# Patient Record
Sex: Male | Born: 1990 | Race: Black or African American | Hispanic: No | Marital: Single | Smoking: Never smoker
Health system: Southern US, Community
[De-identification: ages and names within clinical notes are randomized; demographics above are authoritative.]

## PROBLEM LIST (undated history)

## (undated) HISTORY — PX: WISDOM TOOTH EXTRACTION: SHX21

---

## 2015-12-25 ENCOUNTER — Emergency Department (HOSPITAL_COMMUNITY): Payer: Federal, State, Local not specified - PPO

## 2015-12-25 ENCOUNTER — Emergency Department (HOSPITAL_COMMUNITY)
Admission: EM | Admit: 2015-12-25 | Discharge: 2015-12-25 | Disposition: A | Discharge status: Home / Self Care | Payer: Federal, State, Local not specified - PPO | Attending: Emergency Medicine | Admitting: Emergency Medicine

## 2015-12-25 ENCOUNTER — Encounter (HOSPITAL_COMMUNITY): Payer: Self-pay | Admitting: *Deleted

## 2015-12-25 DIAGNOSIS — K605 Anorectal fistula: Secondary | ICD-10-CM | POA: Diagnosis not present

## 2015-12-25 DIAGNOSIS — L03317 Cellulitis of buttock: Secondary | ICD-10-CM | POA: Insufficient documentation

## 2015-12-25 DIAGNOSIS — L0231 Cutaneous abscess of buttock: Secondary | ICD-10-CM | POA: Insufficient documentation

## 2015-12-25 DIAGNOSIS — L0291 Cutaneous abscess, unspecified: Secondary | ICD-10-CM

## 2015-12-25 DIAGNOSIS — K6289 Other specified diseases of anus and rectum: Secondary | ICD-10-CM | POA: Diagnosis present

## 2015-12-25 LAB — BASIC METABOLIC PANEL
Anion gap: 9 (ref 5–15)
BUN: 9 mg/dL (ref 6–20)
CALCIUM: 9.5 mg/dL (ref 8.9–10.3)
CHLORIDE: 104 mmol/L (ref 101–111)
CO2: 25 mmol/L (ref 22–32)
CREATININE: 0.96 mg/dL (ref 0.61–1.24)
GFR calc Af Amer: >60 mL/min (ref >60)
GFR calc non Af Amer: >60 mL/min (ref >60)
GLUCOSE: 121 mg/dL — AB (ref 65–99)
Potassium: 4.2 mmol/L (ref 3.5–5.1)
Sodium: 138 mmol/L (ref 135–145)

## 2015-12-25 LAB — CBC
HEMATOCRIT: 43.1 % (ref 39.0–52.0)
HEMOGLOBIN: 15.1 g/dL (ref 13.0–17.0)
MCH: 29.2 pg (ref 26.0–34.0)
MCHC: 35 g/dL (ref 30.0–36.0)
MCV: 83.4 fL (ref 78.0–100.0)
Platelets: 294 10*3/uL (ref 150–400)
RBC: 5.17 MIL/uL (ref 4.22–5.81)
RDW: 13 % (ref 11.5–15.5)
WBC: 13.7 10*3/uL — ABNORMAL HIGH (ref 4.0–10.5)

## 2015-12-25 MED ORDER — IOHEXOL 300 MG/ML  SOLN
100.0000 mL | Freq: Once | INTRAMUSCULAR | Status: AC | PRN
  Administered 2015-12-25: 100 mL via INTRAVENOUS

## 2015-12-25 MED ORDER — CLINDAMYCIN HCL 150 MG PO CAPS
300.0000 mg | ORAL_CAPSULE | Freq: Four times a day (QID) | ORAL | Status: AC
Start: 2015-12-25 — End: 2016-01-04

## 2015-12-25 MED ORDER — NAPROXEN 500 MG PO TABS: 500.0000 mg | ORAL_TABLET | Freq: Two times a day (BID) | ORAL | Status: DC

## 2015-12-25 MED ORDER — MORPHINE SULFATE (PF) 4 MG/ML IV SOLN
4.0000 mg | Freq: Once | INTRAVENOUS | Status: AC
Start: 2015-12-25 — End: 2015-12-25
  Administered 2015-12-25: 4 mg via INTRAVENOUS
  Filled 2015-12-25: qty 1

## 2015-12-25 MED ORDER — SODIUM CHLORIDE 0.9 % IV BOLUS (SEPSIS)
1000.0000 mL | Freq: Once | INTRAVENOUS | Status: AC
  Administered 2015-12-25: 1000 mL via INTRAVENOUS

## 2015-12-25 MED ORDER — ONDANSETRON HCL 4 MG/2ML IJ SOLN
4.0000 mg | Freq: Once | INTRAMUSCULAR | Status: AC
  Administered 2015-12-25: 4 mg via INTRAVENOUS
  Filled 2015-12-25: qty 2

## 2015-12-25 MED ORDER — HYDROCODONE-ACETAMINOPHEN 5-325 MG PO TABS: 1.0000 | ORAL_TABLET | ORAL | Status: DC | PRN

## 2015-12-25 NOTE — ED Notes (Signed)
Pt admits to rectal pain x1 week, [facility] and was told he may have a perirectal abscess - pt admits to awaking this morning to [facility] in his underwear. Denies any fever/chills.

## 2015-12-25 NOTE — ED Provider Notes (Signed)
CSN: [id number]     Arrival date & time 12/25/15  4098 History   First MD Initiated Contact with Patient 12/25/15 1039     Chief Complaint  Patient presents with  . Rectal Pain     (Consider location/radiation/quality/duration/timing/severity/associated sxs/prior Treatment) HPI Comments: Rectal pain 1 week Constant Like sitting on something, throbbing Worse with standing/sitting/BM Blood in underwear this AM, bright red blood No hx of rectal trauma No hx of hemorrhoids/rectal problems Micah Flesher to student health on Tuesday, thought it was anal fissure, used cream, however pain worsening Ibuprofen/tylenol not really helping    History reviewed. No pertinent past medical history. Past Surgical History  Procedure Laterality Date  . Wisdom tooth extraction     History reviewed. No pertinent family history. Social History  Substance Use Topics  . Smoking status: Never Smoker   . Smokeless tobacco: None  . Alcohol Use: Yes    Review of Systems  Constitutional: Negative for fever.  HENT: Negative for sore throat.   Eyes: Negative for visual disturbance.  Respiratory: Negative for shortness of breath.   Cardiovascular: Negative for chest pain.  Gastrointestinal: Positive for anal bleeding. Negative for nausea, vomiting, abdominal pain and blood in stool.  Genitourinary: Negative for difficulty urinating.  Musculoskeletal: Negative for back pain and neck stiffness.  Skin: Negative for rash.  Neurological: Negative for syncope and headaches.      Allergies  Review of patient's allergies indicates no known allergies.  Home Medications   Prior to Admission medications   Medication Sig Start Date End Date Taking? Authorizing Provider  acetaminophen (TYLENOL) 325 MG tablet Take 650 mg by mouth every 6 (six) hours as needed for mild pain or moderate pain.    Yes Historical Provider, MD  ibuprofen (ADVIL,MOTRIN) 200 MG tablet Take 200 mg by mouth every 6 (six) hours as needed for  mild pain or moderate pain.    Yes Historical Provider, MD  clindamycin (CLEOCIN) 150 MG capsule Take 2 capsules (300 mg total) by mouth 4 (four) times daily. 12/25/15 01/04/16  Alvira Monday, MD  HYDROcodone-acetaminophen (NORCO/VICODIN) 5-325 MG tablet Take 1 tablet by mouth every 4 (four) hours as needed. 12/25/15   Alvira Monday, MD  naproxen (NAPROSYN) 500 MG tablet Take 1 tablet (500 mg total) by mouth 2 (two) times daily. 12/25/15   Alvira Monday, MD   BP 153/88 mmHg  Pulse 81  Temp(Src) 99 F (37.2 C) (Oral)  Resp 15  SpO2 100% Physical Exam  Constitutional: He is oriented to person, place, and time. He appears well-developed and well-nourished. No distress.  HENT:  Head: Normocephalic and atraumatic.  Eyes: Conjunctivae and EOM are normal.  Neck: Normal range of motion.  Cardiovascular: Normal rate, regular rhythm, normal heart sounds and intact distal pulses.  Exam reveals no gallop and no friction rub.   No murmur heard. Pulmonary/Chest: Effort normal and breath sounds normal. No respiratory distress. He has no wheezes. He has no rales.  Abdominal: Soft. He exhibits no distension. There is no tenderness. There is no guarding.  Genitourinary:  5cm area of fluctuance/induration right buttock, extension perirectally with swelling right rectal area Punctate area open with purulent drainage Significant tenderness to right buttock and rectum No hemorrhoids  Musculoskeletal: He exhibits no edema.  Neurological: He is alert and oriented to person, place, and time.  Skin: Skin is warm and dry. He is not diaphoretic.  Nursing note and vitals reviewed.   ED Course  Procedures (including critical care time) Labs Review  Labs Reviewed  CBC - Abnormal; Notable for the following:    WBC 13.7 (*)    All other components within normal limits  BASIC METABOLIC PANEL - Abnormal; Notable for the following:    Glucose, Bld 121 (*)    All other components within normal limits     Imaging Review Ct Pelvis W Contrast  12/25/2015  CLINICAL DATA:  Rectal pain x1 week, evaluate for perirectal abscess EXAM: CT PELVIS WITH CONTRAST TECHNIQUE: Multidetector CT imaging of the pelvis was performed using the standard protocol following the bolus administration of intravenous contrast. CONTRAST:  100mL OMNIPAQUE IOHEXOL 300 MG/ML  SOLN COMPARISON:  None. FINDINGS: Urinary Tract: Bladder is within normal limits. Bowel: Suspected fistulous track along the right lateral aspect of the rectum (series 2/ image 52) and exiting along the right gluteal cleft (series 2/ image 60). No associated perirectal fluid collection/abscess. Visualized bowel is otherwise unremarkable. Normal appendix (series 2/ image 3). Vascular/Lymphatic: Visualized infrarenal abdominal aorta and bilateral iliac arteries are within normal limits. No suspicious pelvic lymphadenopathy. Reproductive: Prostate is unremarkable. Other: No pelvic ascites. Small fat containing bilateral inguinal hernias (series 2/ image 37). Musculoskeletal: Visualized osseous structures are within normal limits. IMPRESSION: Suspected anorectal fistula along the right lateral aspect of the rectum (9 o'clock position) and exiting along the right gluteal cleft. No associated perirectal fluid collection/abscess. Electronically Signed   By: Charline BillsSriyesh  Krishnan M.D.   On: 12/25/2015 12:18   I have personally reviewed and evaluated these images and lab results as part of my medical decision-making.   EKG Interpretation None      MDM   Final diagnoses:  Abscess  Anorectal fistula  Cellulitis of buttock    25 year old male with no significant medical history presents with concern for rectal pain of one week. [facility].  Exam is concerning for perirectal and perianal inflammation with concern for abscess.   CT pelvis was ordered showing anorectal fistula with no drainable fluid  collection. Patient hemodynamically stable, mild leukocytosis. Discussed with General Surgery, Dr Carolynne Edouardoth. Given no sign of pocket of fluid on CT, Dr. Carolynne Edouardoth recommends follow up with Dr. Maisie Fushomas as an outpatient.  Pt has no history of prior perianal disease, no history of inflammatory bowel syndrome.  Area draining on exam likely fistula tract from prior abscess.  Given surrounding cellulitis/induration and pain, will treat with clindamycin and have pt follow up with Dr. Maisie Fushomas as soon as possible. Gave rx for norco/naproxen for pain control. Patient discharged in stable condition with understanding of reasons to return.   Alvira MondayErin Chrishon Martino, MD 12/25/15 [id number]

## 2016-01-11 ENCOUNTER — Other Ambulatory Visit: Payer: Self-pay | Admitting: Surgery

## 2016-01-12 ENCOUNTER — Ambulatory Visit: Payer: Self-pay | Admitting: Surgery

## 2016-01-12 DIAGNOSIS — E669 Obesity, unspecified: Secondary | ICD-10-CM | POA: Insufficient documentation

## 2016-01-12 DIAGNOSIS — K604 Rectal fistula: Secondary | ICD-10-CM

## 2016-01-12 NOTE — H&P (Addendum)
**Note Brian-Identified via Obfuscation** Brian Gates 01/11/2016 9:36 AM [facility] Patient #: [id number] DOB: Jan 20, 1991 Single / Language: [place] / Race: Black or African American Male   History of Present Illness Ardeth Sportsman MD; 01/12/2016 7:04 AM) Patient words: Anal fistula.  The patient is a 25 year old male who presents with anal fistula. Note for "Anal fistula": Patient seen in surgical consultation at the request of Alvira Monday, MD. Concern for perirectal abscess/fistula  pplend sister.e male. Comes today with his parents and sister - all in the exam room wanting to stay for the whole visit. Please go to [facility]. Noticed n.Went to emergency room. Seem to start to drain.Went to emergency room. Seem to start to drain. They have course to the CT scan. Urologn patient on antibiotics.y be a fistula. Ablation patient on antibiotics. Rehen hn. He does have some persistent drainage.one down. He does have some persistent drainage.one down. He does have some persistent drainage. He does not recall any inciting event with this. He d withbout 1 every [place]. or diarrhea. He normally has about 1 every [place]. or diarrhea. He normally has about 1 every [place]. Hed abspesseiles without difficulty.ore. Can walk a couple miles without difficulty.ore. Can walk a couple miles without difficulty.  No personal nor family history of GI/colon cancer, inflammatory bowel disease, irritable bowel syndrome, allergy such as Celiac Sprue, dietary/dairy problems, colitis, ulcers nor gastritis. No recent sick contacts/gastroenteritis. No travel outside the country. No changes in diet. No dysphagia to solids or liquids. No significant heartburn or reflux. No hematochezia, hematemesis, coffee ground emesis. No evidence of prior gastric/peptic ulceration.   Other Problems Aggie Cosier, RMA; 01/11/2016 9:36 AM) No pertinent past medical history  Past Surgical History Aggie Cosier, RMA; 01/11/2016 9:36  AM) Oral Surgery  Allergies Zella Ball Gwynn, RMA; 01/11/2016 9:37 AM) No Known Drug Allergies03/28/2017  Medication History Aggie Cosier, RMA; 01/11/2016 9:37 AM) No Current Medications Medications Reconciled    Review of Systems Zella Ball Gwynn RMA; 01/11/2016 9:36 AM) General Not Present- Appetite Loss, Chills, Fatigue, Fever, Night Sweats, Weight Gain and Weight Loss. Skin Not Present- Change in Wart/Mole, Dryness, Hives, Jaundice, New Lesions, Non-Healing Wounds, Rash and Ulcer. HEENT Not Present- Earache, Hearing Loss, Hoarseness, Nose Bleed, Oral Ulcers, Ringing in the Ears, Seasonal Allergies, Sinus Pain, Sore Throat, Visual Disturbances, Wears glasses/contact lenses and Yellow Eyes. Respiratory Not Present- Bloody sputum, Chronic Cough, Difficulty Breathing, Snoring and Wheezing. Breast Not Present- Breast Mass, Breast Pain, Nipple Discharge and Skin Changes. Cardiovascular Not Present- Chest Pain, Difficulty Breathing Lying Down, Leg Cramps, Palpitations, Rapid Heart Rate, Shortness of Breath and Swelling of Extremities. Male Genitourinary Not Present- Blood in Urine, Change in Urinary Stream, Frequency, Impotence, Nocturia, Painful Urination, Urgency and Urine Leakage. Musculoskeletal Not Present- Back Pain, Joint Pain, Joint Stiffness, Muscle Pain, Muscle Weakness and Swelling of Extremities. Neurological Not Present- Decreased Memory, Fainting, Headaches, Numbness, Seizures, Tingling, Tremor, Trouble walking and Weakness. Psychiatric Not Present- Anxiety, Bipolar, Change in Sleep Pattern, Depression, Fearful and Frequent crying. Endocrine Not Present- Cold Intolerance, Excessive Hunger, Hair Changes, Heat Intolerance, Hot flashes and New Diabetes. Hematology Not Present- Easy Bruising, Excessive bleeding, Gland problems, HIV and Persistent Infections.  Vitals (Robin Gwynn RMA; 01/11/2016 9:37 AM) 01/11/2016 9:37 AM Weight: 319.6 lb Height: 70in Body Surface Area: 2.55 m Body  Mass Index: 45.86 kg/m  Temp.: 97.104F  Pulse: 106 (Regular)  BP: 140/90 (Sitting, Left Arm, Standard)       Physical Exam Ardeth Sportsman MD; 01/12/2016 7:06 AM) General  Mental Status-Alert. General Appearance-Not in acute distress, Not Sickly. Orientation-Oriented X3. Hydration-Well hydrated. Voice-Normal.  Integumentary Global Assessment Upon inspection and palpation of skin surfaces of the - Axillae: non-tender, no inflammation or ulceration, no drainage. and Distribution of scalp and body hair is normal. General Characteristics Temperature - normal warmth is noted.  Head and Neck Head-normocephalic, atraumatic with no lesions or palpable masses. Face Global Assessment - atraumatic, no absence of expression. Neck Global Assessment - no abnormal movements, no bruit auscultated on the right, no bruit auscultated on the left, no decreased range of motion, non-tender. Trachea-midline. Thyroid Gland Characteristics - non-tender.  Eye Eyeball - Left-Extraocular movements intact, No Nystagmus. Eyeball - Right-Extraocular movements intact, No Nystagmus. Cornea - Left-No Hazy. Cornea - Right-No Hazy. Sclera/Conjunctiva - Left-No scleral icterus, No Discharge. Sclera/Conjunctiva - Right-No scleral icterus, No Discharge. Pupil - Left-Direct reaction to light normal. Pupil - Right-Direct reaction to light normal.  ENMT Ears Pinna - Left - no drainage observed, no generalized tenderness observed. Right - no drainage observed, no generalized tenderness observed. Nose and Sinuses External Inspection of the Nose - no destructive lesion observed. Inspection of the nares - Left - quiet respiration. Right - quiet respiration. Mouth and Throat Lips - Upper Lip - no fissures observed, no pallor noted. Lower Lip - no fissures observed, no pallor noted. Nasopharynx - no discharge present. Oral Cavity/Oropharynx - Tongue - no dryness observed. Oral  Mucosa - no cyanosis observed. Hypopharynx - no evidence of airway distress observed.  Chest and Lung Exam Inspection Movements - Normal and Symmetrical. Accessory muscles - No use of accessory muscles in breathing. Palpation Palpation of the chest reveals - Non-tender. Auscultation Breath sounds - Normal and Clear.  Cardiovascular Auscultation Rhythm - Regular. Murmurs & Other Heart Sounds - Auscultation of the heart reveals - No Murmurs and No Systolic Clicks.  Abdomen Inspection Inspection of the abdomen reveals - No Visible peristalsis and No Abnormal pulsations. Umbilicus - No Bleeding, No Urine drainage. Palpation/Percussion Palpation and Percussion of the abdomen reveal - Soft, Non Tender, No Rebound tenderness, No Rigidity (guarding) and No Cutaneous hyperesthesia. Note: Morbidly obese but soft. No diastases. No umbilical hernia.   Male Genitourinary Sexual Maturity Tanner 5 - Adult hair pattern and Adult penile size and shape. Note: Normal external genitalia. Epididymi, testes, and spermatic cords normal without any masses. No inguinal hernias.   Rectal Note: RIGHT anterior granulating sinus consistent with fistula. Can express some thinly purulent material with pressure. Cord/know tracking toward sphincter. I held off on any more aggressive digital or anoscopic exam.   Perianal skin clean with fair hygiene. No pruritis ani. No pilonidal disease. No fissure. No warts/condyloma. Normal sphincter tone. External hemorrhoids: None   Peripheral Vascular Upper Extremity Inspection - Left - No Cyanotic nailbeds, Not Ischemic. Right - No Cyanotic nailbeds, Not Ischemic.  Neurologic Neurologic evaluation reveals -normal attention span and ability to concentrate, able to name objects and repeat phrases. Appropriate fund of knowledge , normal sensation and normal coordination. Mental Status Affect - not angry, not paranoid. Cranial Nerves-Normal  Bilaterally. Gait-Normal.  Neuropsychiatric Mental status exam performed with findings of-able to articulate well with normal speech/language, rate, volume and coherence, thought content normal with ability to perform basic computations and apply abstract reasoning and no evidence of hallucinations, delusions, obsessions or homicidal/suicidal ideation.  Musculoskeletal Global Assessment Spine, Ribs and Pelvis - no instability, subluxation or laxity. Right Upper Extremity - no instability, subluxation or laxity.  Lymphatic Head & Neck  General  Head & Neck Lymphatics: Bilateral - Description - No Localized lymphadenopathy. Axillary  General Axillary Region: Bilateral - Description - No Localized lymphadenopathy. Femoral & Inguinal  Generalized Femoral & Inguinal Lymphatics: Left - Description - No Localized lymphadenopathy. Right - Description - No Localized lymphadenopathy.    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 No evidence of active infection.  I think this warrants examination under anesthesia with treatment of fistula. Maybe superficial fistulotomy for versus LIFT repair of something more intersphincteric.  They were expecting me to do it right now. They recall the ED saying that was how it would happen. I noted it doesn't work that way. I need tbiotiis incorrect.metimes the emergency room diagnosis is incorrect.metimes the emergency room diagnosis is incorrect.metimes the emergency room diagnosis is incorrect. Therefore consultation needed first to diagnose and offer plans.Certainly I do not wait too long on this but would like to coordinate a convenient time. Discussed at length with the patient, his parents, his sister. All were in the room for the entire visit. Current Plans Pt Education - Pamphlet Given - Anal Abscess / Fistula: discussed with patient and provided  information. You are being scheduled for surgery - Our schedulers will call you.  You should hear from our office's scheduling department within 5 working days about the location, date, and time of surgery. We try to make accommodations for patient's preferences in scheduling surgery, but sometimes the OR schedule or the surgeon's schedule prevents us from making those accommodations.  If you have not heard from our office [phone]) in 5 working days, call the office and ask for your surgeon's nurse.  If you have other questions about your diagnosis, plan, or surgery, call the office and ask for your surgeon's nurse.  Pt Education - [facility] Abscess/Fistula (AT): discussed with patient and provided information. Pt Education - [facility] Good Bowel Health (Alcide Memoli) The anatomy & physiology of the anorectal region was discussed. We discussed the pathophysiology of anorectal abscess and fistula. Differential diagnosis was discussed. Natural history progression was discussed. I stressed the importance of a bowel regimen to have daily soft bowel movements to minimize progression of disease.  The patient's condition is not adequately controlled. Non-operative treatment has not healed the fistula. Therefore, I recommended examination under anaesthesia to confirm the diagnosis and treat the fistula. I discussed techniques that may be required such as fistulotomy, ligation by LIFT technique, and/or seton placement. Benefits & alternatives discussed. I noted a good likelihood this will help address the problem, but sometimes repeat operations and prolonged healing times may occur. Risks such as bleeding, pain, recurrence, reoperation, incontinence, heart attack, death, and other risks were discussed.  Educational handouts further explaining the pathology, treatment options, and bowel regimen were given. The patient expressed understanding & wishes to proceed. We will work to coordinate surgery for a mutually convenient  time.  Pt Education - [facility] Rectal Surgery HCI ([place]): discussed with patient and provided information.  Ardeth SportsmanSteven C. Adrain Nesbit, M.D., F.A.C.S. Gastrointestinal and Minimally Invasive Surgery [facility] [address] [place], [place] [id number] [phone] Main / Paging  I have re-reviewed the the patient's records, history, medications, and allergies.  I have re-examined the patient.  I again discussed intraoperative plans and goals of post-operative recovery.  The patient agrees to proceed.  Ardeth SportsmanSteven C. Payal Stanforth, M.D., F.A.C.S. Gastrointestinal and Minimally Invasive Surgery [facility] [address], [phone]  [place], [place] [id number] [phone] Main / Paging

## 2016-01-19 ENCOUNTER — Encounter (HOSPITAL_COMMUNITY): Payer: Self-pay | Admitting: *Deleted

## 2016-01-20 ENCOUNTER — Ambulatory Visit (HOSPITAL_COMMUNITY): Payer: Federal, State, Local not specified - PPO | Admitting: Certified Registered Nurse Anesthetist

## 2016-01-20 ENCOUNTER — Ambulatory Visit (HOSPITAL_COMMUNITY)
Admission: RE | Admit: 2016-01-20 | Discharge: 2016-01-20 | Disposition: A | Discharge status: Home / Self Care | Payer: Federal, State, Local not specified - PPO | Source: Ambulatory Visit | Attending: Surgery | Admitting: Surgery

## 2016-01-20 ENCOUNTER — Encounter (HOSPITAL_COMMUNITY)
Admission: RE | Disposition: A | Discharge status: Home / Self Care | Payer: Self-pay | Source: Ambulatory Visit | Attending: Surgery

## 2016-01-20 ENCOUNTER — Encounter (HOSPITAL_COMMUNITY): Payer: Self-pay | Admitting: *Deleted

## 2016-01-20 DIAGNOSIS — Z6841 Body Mass Index (BMI) 40.0 and over, adult: Secondary | ICD-10-CM | POA: Diagnosis not present

## 2016-01-20 DIAGNOSIS — K648 Other hemorrhoids: Secondary | ICD-10-CM | POA: Diagnosis not present

## 2016-01-20 DIAGNOSIS — K604 Rectal fistula: Secondary | ICD-10-CM | POA: Diagnosis present

## 2016-01-20 DIAGNOSIS — K603 Anal fistula: Secondary | ICD-10-CM | POA: Diagnosis present

## 2016-01-20 HISTORY — PX: EVALUATION UNDER ANESTHESIA WITH FISTULECTOMY: SHX5623

## 2016-01-20 LAB — CBC
HCT: 44.2 % (ref 39.0–52.0)
Hemoglobin: 14.9 g/dL (ref 13.0–17.0)
MCH: 28.8 pg (ref 26.0–34.0)
MCHC: 33.7 g/dL (ref 30.0–36.0)
MCV: 85.5 fL (ref 78.0–100.0)
PLATELETS: 245 10*3/uL (ref 150–400)
RBC: 5.17 MIL/uL (ref 4.22–5.81)
RDW: 13.1 % (ref 11.5–15.5)
WBC: 8.4 10*3/uL (ref 4.0–10.5)

## 2016-01-20 SURGERY — EXAM UNDER ANESTHESIA WITH FISTULECTOMY
Anesthesia: General | Site: Anus

## 2016-01-20 MED ORDER — BUPIVACAINE-EPINEPHRINE (PF) 0.25% -1:200000 IJ SOLN
INTRAMUSCULAR | Status: AC
  Filled 2016-01-20: qty 30

## 2016-01-20 MED ORDER — BUPIVACAINE-EPINEPHRINE 0.25% -1:200000 IJ SOLN
INTRAMUSCULAR | Status: AC
  Filled 2016-01-20: qty 1

## 2016-01-20 MED ORDER — METHYLENE BLUE 0.5 % INJ SOLN
INTRAVENOUS | Status: AC
  Filled 2016-01-20: qty 10

## 2016-01-20 MED ORDER — NEOSTIGMINE METHYLSULFATE 10 MG/10ML IV SOLN
INTRAVENOUS | Status: AC
  Filled 2016-01-20: qty 1

## 2016-01-20 MED ORDER — CEFOTETAN DISODIUM-DEXTROSE 2-2.08 GM-% IV SOLR
2.0000 g | INTRAVENOUS | Status: AC
  Administered 2016-01-20: 2 g via INTRAVENOUS

## 2016-01-20 MED ORDER — GLYCOPYRROLATE 0.2 MG/ML IJ SOLN
INTRAMUSCULAR | Status: DC | PRN
  Administered 2016-01-20: .8 mg via INTRAVENOUS

## 2016-01-20 MED ORDER — LIDOCAINE HCL (CARDIAC) 20 MG/ML IV SOLN
INTRAVENOUS | Status: DC | PRN
  Administered 2016-01-20: 100 mg via INTRAVENOUS

## 2016-01-20 MED ORDER — METHYLENE BLUE 0.5 % INJ SOLN
INTRAVENOUS | Status: DC | PRN
  Administered 2016-01-20: 5 mL via SUBMUCOSAL

## 2016-01-20 MED ORDER — LACTATED RINGERS IV SOLN
INTRAVENOUS | Status: DC | PRN
  Administered 2016-01-20 (×2): via INTRAVENOUS

## 2016-01-20 MED ORDER — ROCURONIUM BROMIDE 100 MG/10ML IV SOLN
INTRAVENOUS | Status: DC | PRN
  Administered 2016-01-20: 20 mg via INTRAVENOUS

## 2016-01-20 MED ORDER — PROPOFOL 10 MG/ML IV BOLUS
INTRAVENOUS | Status: AC
  Filled 2016-01-20: qty 20

## 2016-01-20 MED ORDER — BUPIVACAINE LIPOSOME 1.3 % IJ SUSP
20.0000 mL | INTRAMUSCULAR | Status: DC
  Filled 2016-01-20: qty 20

## 2016-01-20 MED ORDER — BUPIVACAINE LIPOSOME 1.3 % IJ SUSP
Freq: Once | INTRAMUSCULAR | Status: DC
  Filled 2016-01-20: qty 20

## 2016-01-20 MED ORDER — HYDROCODONE-ACETAMINOPHEN 5-325 MG PO TABS: 1.0000 | ORAL_TABLET | ORAL | Status: AC | PRN

## 2016-01-20 MED ORDER — HYDROGEN PEROXIDE 3 % EX SOLN
CUTANEOUS | Status: AC
  Filled 2016-01-20: qty 473

## 2016-01-20 MED ORDER — PROPOFOL 10 MG/ML IV BOLUS
INTRAVENOUS | Status: DC | PRN
  Administered 2016-01-20: 250 mg via INTRAVENOUS

## 2016-01-20 MED ORDER — LIDOCAINE HCL (CARDIAC) 20 MG/ML IV SOLN
INTRAVENOUS | Status: AC
Start: 2016-01-20 — End: 2016-01-20
  Filled 2016-01-20: qty 5

## 2016-01-20 MED ORDER — ONDANSETRON HCL 4 MG/2ML IJ SOLN
INTRAMUSCULAR | Status: AC
  Filled 2016-01-20: qty 2

## 2016-01-20 MED ORDER — BUPIVACAINE-EPINEPHRINE (PF) 0.25% -1:200000 IJ SOLN
INTRAMUSCULAR | Status: DC | PRN
  Administered 2016-01-20: 20 mL via PERINEURAL

## 2016-01-20 MED ORDER — FENTANYL CITRATE (PF) 250 MCG/5ML IJ SOLN
INTRAMUSCULAR | Status: AC
  Filled 2016-01-20: qty 5

## 2016-01-20 MED ORDER — BUPIVACAINE LIPOSOME 1.3 % IJ SUSP
INTRAMUSCULAR | Status: DC | PRN
  Administered 2016-01-20: 20 mL

## 2016-01-20 MED ORDER — FENTANYL CITRATE (PF) 100 MCG/2ML IJ SOLN: 25.0000 ug | INTRAMUSCULAR | Status: DC | PRN

## 2016-01-20 MED ORDER — FENTANYL CITRATE (PF) 100 MCG/2ML IJ SOLN
INTRAMUSCULAR | Status: AC
  Filled 2016-01-20: qty 2

## 2016-01-20 MED ORDER — METOCLOPRAMIDE HCL 5 MG/ML IJ SOLN: 10.0000 mg | Freq: Once | INTRAMUSCULAR | Status: DC | PRN

## 2016-01-20 MED ORDER — MIDAZOLAM HCL 5 MG/5ML IJ SOLN
INTRAMUSCULAR | Status: DC | PRN
  Administered 2016-01-20 (×2): 1 mg via INTRAVENOUS

## 2016-01-20 MED ORDER — SUCCINYLCHOLINE CHLORIDE 20 MG/ML IJ SOLN
INTRAMUSCULAR | Status: DC | PRN
  Administered 2016-01-20: 100 mg via INTRAVENOUS

## 2016-01-20 MED ORDER — MEPERIDINE HCL 50 MG/ML IJ SOLN: 6.2500 mg | INTRAMUSCULAR | Status: DC | PRN

## 2016-01-20 MED ORDER — CEFOTETAN DISODIUM-DEXTROSE 2-2.08 GM-% IV SOLR
INTRAVENOUS | Status: AC
  Filled 2016-01-20: qty 50

## 2016-01-20 MED ORDER — LACTATED RINGERS IV SOLN: INTRAVENOUS | Status: DC

## 2016-01-20 MED ORDER — FENTANYL CITRATE (PF) 250 MCG/5ML IJ SOLN
INTRAMUSCULAR | Status: DC | PRN
  Administered 2016-01-20: 50 ug via INTRAVENOUS
  Administered 2016-01-20: 25 ug via INTRAVENOUS

## 2016-01-20 MED ORDER — CHLORHEXIDINE GLUCONATE 4 % EX LIQD: 1.0000 "application " | Freq: Once | CUTANEOUS | Status: DC

## 2016-01-20 MED ORDER — PHENYLEPHRINE HCL 10 MG/ML IJ SOLN
INTRAMUSCULAR | Status: DC | PRN
  Administered 2016-01-20 (×4): 80 ug via INTRAVENOUS

## 2016-01-20 MED ORDER — NEOSTIGMINE METHYLSULFATE 10 MG/10ML IV SOLN
INTRAVENOUS | Status: DC | PRN
  Administered 2016-01-20: 5 mg via INTRAVENOUS

## 2016-01-20 MED ORDER — DIBUCAINE 1 % RE OINT
TOPICAL_OINTMENT | RECTAL | Status: AC
  Filled 2016-01-20: qty 28

## 2016-01-20 MED ORDER — MIDAZOLAM HCL 2 MG/2ML IJ SOLN
INTRAMUSCULAR | Status: AC
  Filled 2016-01-20: qty 2

## 2016-01-20 MED ORDER — NAPROXEN 500 MG PO TABS: 500.0000 mg | ORAL_TABLET | Freq: Two times a day (BID) | ORAL | Status: AC

## 2016-01-20 MED ORDER — GLYCOPYRROLATE 0.2 MG/ML IJ SOLN
INTRAMUSCULAR | Status: AC
  Filled 2016-01-20: qty 3

## 2016-01-20 SURGICAL SUPPLY — 32 items
BLADE SURG 15 STRL LF DISP TIS (BLADE) ×1 IMPLANT
BLADE SURG 15 STRL SS (BLADE) ×1
COVER SURGICAL LIGHT HANDLE (MISCELLANEOUS) ×2 IMPLANT
DECANTER SPIKE VIAL GLASS SM (MISCELLANEOUS) ×2 IMPLANT
DRAPE LAPAROTOMY TRNSV 102X78 (DRAPE) ×2 IMPLANT
DRSG PAD ABDOMINAL 8X10 ST (GAUZE/BANDAGES/DRESSINGS) ×2 IMPLANT
ELECT PENCIL ROCKER SW 15FT (MISCELLANEOUS) ×2 IMPLANT
ELECT REM PT RETURN 9FT ADLT (ELECTROSURGICAL) ×2
ELECTRODE REM PT RTRN 9FT ADLT (ELECTROSURGICAL) ×1 IMPLANT
GAUZE SPONGE 4X4 12PLY STRL (GAUZE/BANDAGES/DRESSINGS) IMPLANT
GAUZE SPONGE 4X4 16PLY XRAY LF (GAUZE/BANDAGES/DRESSINGS) ×2 IMPLANT
GLOVE ECLIPSE 8.0 STRL XLNG CF (GLOVE) ×2 IMPLANT
GLOVE INDICATOR 8.0 STRL GRN (GLOVE) ×2 IMPLANT
GOWN STRL REUS W/TWL XL LVL3 (GOWN DISPOSABLE) ×4 IMPLANT
IV CATH 14GX2 1/4 (CATHETERS) ×2 IMPLANT
KIT BASIN OR (CUSTOM PROCEDURE TRAY) ×2 IMPLANT
LUBRICANT JELLY K Y 4OZ (MISCELLANEOUS) ×2 IMPLANT
NEEDLE HYPO 22GX1.5 SAFETY (NEEDLE) ×4 IMPLANT
PACK BASIC VI WITH GOWN DISP (CUSTOM PROCEDURE TRAY) ×2 IMPLANT
SUT CHROMIC 2 0 SH (SUTURE) ×2 IMPLANT
SUT CHROMIC 3 0 SH 27 (SUTURE) ×4 IMPLANT
SUT MNCRL AB 4-0 PS2 18 (SUTURE) IMPLANT
SUT PROLENE 2 0 SH DA (SUTURE) IMPLANT
SUT VIC AB 2-0 UR6 27 (SUTURE) ×4 IMPLANT
SUT VIC AB 3-0 SH 18 (SUTURE) ×2 IMPLANT
SUT VIC AB 3-0 SH 27 (SUTURE)
SUT VIC AB 3-0 SH 27XBRD (SUTURE) IMPLANT
SYR 20CC LL (SYRINGE) ×4 IMPLANT
SYRINGE 10CC LL (SYRINGE) ×4 IMPLANT
TOWEL OR 17X26 10 PK STRL BLUE (TOWEL DISPOSABLE) ×2 IMPLANT
TOWEL OR NON WOVEN STRL DISP B (DISPOSABLE) ×2 IMPLANT
YANKAUER SUCT BULB TIP 10FT TU (MISCELLANEOUS) ×2 IMPLANT

## 2016-01-20 NOTE — Op Note (Signed)
01/20/2016  10:58 AM  PATIENT:  Brian Gates  25 y.o. male  Patient Care Team: No Pcp Per Patient as PCP - General (General Practice)  PRE-OPERATIVE DIAGNOSIS:  Perirectal fistula  POST-OPERATIVE DIAGNOSIS:  Perirectal fistula  PROCEDURE:    EXAM UNDER ANESTHESIA  LIFT REPAIR of INTERSPHINCTERIC PERIRECTAL FISTULA   SURGEON:  Surgeon(s): Karie Soda, MD  ASSISTANT: RN   ANESTHESIA:   Local field block Anorectal block General  0.25% bupivacaine with epinephrine at the beginning of the case.  Liposomal bupivacaine (Experel) at the end of the case.  EBL:     Delay start of Pharmacological VTE agent (>24hrs) due to surgical blood loss or risk of bleeding:  no  DRAINS: none   SPECIMEN:  Source of Specimen:  Perirectal fistulous tract  DISPOSITION OF SPECIMEN:  PATHOLOGY  COUNTS:  YES  PLAN OF CARE: Discharge to home after PACU  PATIENT DISPOSITION:  PACU - hemodynamically stable.  INDICATION: Patient with probable perirectal fistula.  I recommended examination and surgical treatment:  The anatomy & physiology of the anorectal region was discussed.  We discussed the pathophysiology of anorectal abscess and fistula.  Differential diagnosis was discussed.  Natural history progression was discussed.   I stressed the importance of a bowel regimen to have daily soft bowel movements to minimize progression of disease.     The patient's condition is not adequately controlled.  Non-operative treatment has not healed the fistula.  Therefore, I recommended examination under anaesthesia to confirm the diagnosis and treat the fistula.  I discussed techniques that may be required such as fistulotomy, ligation by LIFT technique, and/or seton placement.  Benefits & alternatives discussed.  I noted a good likelihood this will help address the problem, but sometimes repeat operations and prolonged healing times may occur.  Risks such as bleeding, pain, recurrence, reoperation,  incontinence, heart attack, death, and other risks were discussed.      Educational handouts further explaining the pathology, treatment options, and bowel regimen were given.  The patient expressed understanding & wishes to proceed.  We will work to coordinate surgery for a mutually convenient time.   OR FINDINGS: Patient had an intersphincteric fistula.    External location Right anteriolateral (10 o'clock lithotomy), 4cm distal to anal verge    Internal location : Anterior midline anal crypt (11:30 o'clock in lithotomy) about 1 cm from anal verge.  DESCRIPTION:   Informed consent was confirmed. Patient underwent general anesthesia without difficulty. Patient was placed into jackknife prone positioning.  The perianal region was prepped and draped in sterile fashion. Surgical timeout confirmed or plan.  I did digital rectal examination and then transitioned over to anoscopy to get a sense of the anatomy.  I did place a probe through the external opening.   I also injected the track with methylene blue.  With this I was able to locate an exit point at the anterior midline anal crypts.  The tract did not feel superficial, concerning for a probable intersphincteric fistula.  No abscess located.  I went ahead and proceeded with the LIFT technique.  I began to excise the external opening with a radial biconcave incision around it.  I transitioned to cautery and help free the fistulous tract circumferentially all way down towards the sphincter component.    I made a vertical incision at the anal squamocolumnar junction .  Did careful dissection to get down to the sphincter.  I carefully went between the internal and external sphincter using careful blunt  dissection parallel to the fibers.  I was able to locate the intersphincteric component of the fistulous tract.  I was able to get around it gently with a right angle clamp.  I carefully skeletonized the intersphincteric component.  I placed 2-0  Vicryl stitches through the intersphincteric tract on the rectal side and on the external side in between the external & internal sphincters - surgical ligations of the tract.  I transected the fistulous tract.  I removed the superficial end of the fistulous tract just outside the sphinceter component.  I excised the right anterior hemorrhoid pile to help flatten the anorectal region region out & excise the internal opening.  I placed a figure-of eight stitch 5cm proximal to the anal verge & ran vertically to close the wound, going just distal to the sphincters & then ran back proximally to tie it down.  I marsupialized the perianal wound with interrupted running chromic suture around the circumference of the external wound.  I reexamined the anal canal.   There is was no narrowing.  Hemostasis was excellent.  I repeated anoscopy and examination.  Hemostasis was good. The external fistulotomy was pretty superficial & wide open & flat, so no extra packing was needed.  Patient is being extubated go to recovery room.  I am about to discuss the patient's status to the family.  Instructions are written as well.  Ardeth SportsmanSteven C. Vuk Skillern, M.D., F.A.C.S. [facility] [address] [place], [place] [id number] [phone] Main / Paging

## 2016-01-20 NOTE — Anesthesia Postprocedure Evaluation (Signed)
Anesthesia Post Note  Patient: Brian Gates  Procedure(s) Performed: Procedure(s) (LRB): EXAM UNDER ANESTHESIA REPAIR PERIRECTAL FISTULA , LIFT repair of enter spincteric fistula (N/A)  Patient location during evaluation: PACU Anesthesia Type: General Level of consciousness: awake and alert Pain management: pain level controlled Vital Signs Assessment: post-procedure vital signs reviewed and stable Respiratory status: spontaneous breathing, nonlabored ventilation, respiratory function stable and patient connected to nasal cannula oxygen Cardiovascular status: blood pressure returned to baseline and stable Postop Assessment: no signs of nausea or vomiting Anesthetic complications: no    Last Vitals:  Filed Vitals:   01/20/16 1207 01/20/16 1236  BP: 162/98 140/92  Pulse:    Temp: 36.5 C   Resp: 16 16    Last Pain: There were no vitals filed for this visit.               Phillips Groutarignan, Mubashir Mallek

## 2016-01-20 NOTE — Anesthesia Preprocedure Evaluation (Signed)
Anesthesia Evaluation  Patient identified by MRN, date of birth, ID band Patient awake    Reviewed: Allergy & Precautions, NPO status , Patient's Chart, lab work & pertinent test results  Airway Mallampati: II  TM Distance: >3 FB Neck ROM: Full    Dental no notable dental hx.    Pulmonary neg pulmonary ROS,    Pulmonary exam normal breath sounds clear to auscultation       Cardiovascular negative cardio ROS Normal cardiovascular exam Rhythm:Regular Rate:Normal     Neuro/Psych negative neurological ROS  negative psych ROS   GI/Hepatic negative GI ROS, Neg liver ROS,   Endo/Other  Morbid obesity  Renal/GU negative Renal ROS  negative genitourinary   Musculoskeletal negative musculoskeletal ROS (+)   Abdominal   Peds negative pediatric ROS (+)  Hematology negative hematology ROS (+)   Anesthesia Other Findings   Reproductive/Obstetrics negative OB ROS                             Anesthesia Physical Anesthesia Plan  ASA: III  Anesthesia Plan: General   Post-op Pain Management:    Induction: Intravenous  Airway Management Planned: Oral ETT and LMA  Additional Equipment:   Intra-op Plan:   Post-operative Plan: Extubation in OR  Informed Consent: I have reviewed the patients History and Physical, chart, labs and discussed the procedure including the risks, benefits and alternatives for the proposed anesthesia with the patient or authorized representative who has indicated his/her understanding and acceptance.   Dental advisory given  Plan Discussed with: CRNA  Anesthesia Plan Comments:         Anesthesia Quick Evaluation

## 2016-01-20 NOTE — Transfer of Care (Signed)
Immediate Anesthesia Transfer of Care Note  Patient: Brian Gates  Procedure(s) Performed: Procedure(s): EXAM UNDER ANESTHESIA REPAIR PERIRECTAL FISTULA , LIFT repair of enter spincteric fistula (N/A)  Patient Location: PACU  Anesthesia Type:General  Level of Consciousness: awake and alert   Airway & Oxygen Therapy: Patient Spontanous Breathing and Patient connected to face mask oxygen  Post-op Assessment: Report given to RN and Post -op Vital signs reviewed and stable  Post vital signs: Reviewed and stable  Last Vitals:  Filed Vitals:   01/20/16 0812  BP: 153/101  Pulse: 95  Temp: 37.1 C  Resp: 18    Complications: No apparent anesthesia complications

## 2016-01-20 NOTE — Discharge Instructions (Signed)
ANORECTAL SURGERY:  POST OPERATIVE INSTRUCTIONS  1. Take your usually prescribed home medications unless otherwise directed. 2. DIET: Follow a light bland diet the first 24 hours after arrival home, such as soup, liquids, crackers, etc.  Be sure to include lots of fluids daily.  Avoid fast food or heavy meals as your are more likely to get nauseated.  Eat a low fat the next few days after surgery.   3. PAIN CONTROL: a. Pain is best controlled by a usual combination of three different methods TOGETHER: i. Ice/Heat ii. Over the counter pain medication iii. Prescription pain medication b. Most patients will experience some swelling and discomfort in the anus/rectal area. and incisions.  Ice packs or heat (30-60 minutes up to 6 times a Jeremiah) will help. Use ice for the first few days to help decrease swelling and bruising, then switch to heat such as warm towels, sitz baths, warm baths, etc to help relax tight/sore spots and speed recovery.  Some people prefer to use ice alone, heat alone, alternating between ice & heat.  Experiment to what works for you.  Swelling and bruising can take several weeks to resolve.   c. It is helpful to take an over-the-counter pain medication regularly for the first few weeks.  Choose one of the following that works best for you: i. Naproxen (Aleve, etc)  Two '220mg'$  tabs twice a Wortley ii. Ibuprofen (Advil, etc) Three '200mg'$  tabs four times a Mcfayden (every meal & bedtime) iii. Acetaminophen (Tylenol, etc) 500-'650mg'$  four times a Bloch (every meal & bedtime) d. A  prescription for pain medication (such as oxycodone, hydrocodone, etc) should be given to you upon discharge.  Take your pain medication as prescribed.  i. If you are having problems/concerns with the prescription medicine (does not control pain, nausea, vomiting, rash, itching, etc), please call us [phone] to see if we need to switch you to a different pain medicine that will work better for you and/or control your  side effect better. ii. If you need a refill on your pain medication, please contact your pharmacy.  They will contact our office to request authorization. Prescriptions will not be filled after 5 pm or on week-ends.  Use a Sitz Bath 4-8 times a Mol for relief   [employer] A sitz bath is a warm water bath taken in the sitting position that covers only the hips and buttocks. It may be used for either healing or hygiene purposes. Sitz baths are also used to relieve pain, itching, or muscle spasms. The water may contain medicine. Moist heat will help you heal and relax.  HOME CARE INSTRUCTIONS  Take 3 to 4 sitz baths a Montejano.  Fill the bathtub half full with warm water.  Sit in the water and open the drain a little.  Turn on the warm water to keep the tub half full. Keep the water running constantly.  Soak in the water for 15 to 20 minutes.  After the sitz bath, pat the affected area dry first.   4. KEEP YOUR BOWELS REGULAR a. The goal is one bowel movement a Buescher b. Avoid getting constipated.  Between the surgery and the pain medications, it is common to experience some constipation.  Increasing fluid intake and taking a fiber supplement (such as Metamucil, Citrucel, FiberCon, MiraLax, etc) 1-2 times a Zeringue regularly will usually help prevent this problem from occurring.  A mild laxative (prune juice, Milk of Magnesia, MiraLax, etc) should be taken according to package  directions if there are no bowel movements after 48 hours. c. Watch out for diarrhea.  If you have many loose bowel movements, simplify your diet to bland foods & liquids for a few days.  Stop any stool softeners and decrease your fiber supplement.  Switching to mild anti-diarrheal medications (Kayopectate, Pepto Bismol) can help.  If this worsens or does not improve, please call us.  5. Wound Care  a. Remove your bandages the Cousins after surgery.  Unless discharge instructions indicate otherwise, leave your bandage dry and in  place overnight.  Remove the bandage during your first bowel movement.   b. Wear an absorbent pad or soft cotton gauze in your underwear as needed to catch any drainage and help keep the area  c. Keep the area clean and dry.  Bathe / shower every Cisnero.  Keep the area clean by showering / bathing over the incision / wound.   It is okay to soak an open wound to help wash it.  Wet wipes or showers / gentle washing after bowel movements is often less traumatic than regular toilet paper. d. Dennis Bast will often notice bleeding with bowel movements.  This should slow down by the end of the first week of surgery e. Expect some drainage.  This should slow down, too, by the end of the first week of surgery.  Wear an absorbent pad or soft cotton gauze in your underwear until the drainage stops.  6. ACTIVITIES as tolerated:   a. You may resume regular (light) daily activities beginning the next Sek--such as daily self-care, walking, climbing stairs--gradually increasing activities as tolerated.  If you can walk 30 minutes without difficulty, it is safe to try more intense activity such as jogging, treadmill, bicycling, low-impact aerobics, swimming, etc. b. Save the most intensive and strenuous activity for last such as sit-ups, heavy lifting, contact sports, etc  Refrain from any heavy lifting or straining until you are off narcotics for pain control.   c. DO NOT PUSH THROUGH PAIN.  Let pain be your guide: If it hurts to do something, don't do it.  Pain is your body warning you to avoid that activity for another week until the pain goes down. d. You may drive when you are no longer taking prescription pain medication, you can comfortably sit for long periods of time, and you can safely maneuver your car and apply brakes. e. Dennis Bast may have sexual intercourse when it is comfortable.  7. FOLLOW UP in our office a. Please call [facility] at [phone] to set up an appointment to see your surgeon in the office for a follow-up  appointment approximately 2 weeks after your surgery. b. Make sure that you call for this appointment the Sulton you arrive home to insure a convenient appointment time. 10. IF YOU HAVE DISABILITY OR FAMILY LEAVE FORMS, BRING THEM TO THE OFFICE FOR PROCESSING.  DO NOT GIVE THEM TO YOUR DOCTOR.        WHEN TO CALL us [phone]: 1. Poor pain control 2. Reactions / problems with new medications (rash/itching, nausea, etc)  3. Fever over 101.5 F (38.5 C) 4. Inability to urinate 5. Nausea and/or vomiting 6. Worsening swelling or bruising 7. Continued bleeding from incision. 8. Increased pain, redness, or drainage from the incision  The clinic staff is available to answer your questions during regular business hours (8:30am-5pm).  Please dont hesitate to call and ask to speak to one of our nurses for clinical concerns.   A  surgeon from [facility] is always on call at the hospitals   If you have a medical emergency, go to the nearest emergency room or call 911.    [facility], [address], [place], [place], [place]  [address] ? MAIN: [phone] ? TOLL FREE: [phone] ? FAX [phone] [email or web address]    GETTING TO GOOD BOWEL HEALTH. Irregular bowel habits such as constipation and diarrhea can lead to many problems over time.  Having one soft bowel movement a Tarkowski is the most important way to prevent further problems.  The anorectal canal is designed to handle stretching and feces to safely manage our ability to get rid of solid waste (feces, poop, stool) out of our body.  BUT, hard constipated stools can act like ripping concrete bricks and diarrhea can be a burning fire to this very sensitive area of our body, causing inflamed hemorrhoids, anal fissures, increasing risk is perirectal abscesses, abdominal pain/bloating, an making irritable bowel worse.      The goal: ONE SOFT BOWEL MOVEMENT A Kohlenberg!  To have soft, regular  bowel movements:   Drink plenty of fluids, consider 4-6 tall glasses of water a Dillenbeck.    Take plenty of fiber.  Fiber is the undigested part of plant food that passes into the colon, acting s natures broom to encourage bowel motility and movement.  Fiber can absorb and hold large amounts of water. This results in a larger, bulkier stool, which is soft and easier to pass. Work gradually over several weeks up to 6 servings a Tino of fiber (25g a Bawa even more if needed) in the form of: o Vegetables -- Root (potatoes, carrots, turnips), leafy green (lettuce, salad greens, celery, spinach), or cooked high residue (cabbage, broccoli, etc) o Fruit -- Fresh (unpeeled skin & pulp), Dried (prunes, apricots, cherries, etc ),  or stewed ( applesauce)  o Whole grain breads, pasta, etc (whole wheat)  o Bran cereals   Bulking Agents -- This type of water-retaining fiber generally is easily obtained each Bourgoin by one of the following:  o Psyllium bran -- The psyllium plant is remarkable because its ground seeds can retain so much water. This product is available as Metamucil, Konsyl, Effersyllium, Per Diem Fiber, or the less expensive generic preparation in drug and health food stores. Although labeled a laxative, it really is not a laxative.  o Methylcellulose -- This is another fiber derived from wood which also retains water. It is available as Citrucel. o Polyethylene Glycol - and artificial fiber commonly called Miralax or Glycolax.  It is helpful for people with gassy or bloated feelings with regular fiber o Flax Seed - a less gassy fiber than psyllium  No reading or other relaxing activity while on the toilet. If bowel movements take longer than 5 minutes, you are too constipated  AVOID CONSTIPATION.  High fiber and water intake usually takes care of this.  Sometimes a laxative is needed to stimulate more frequent bowel movements, but   Laxatives are not a good long-term solution as it can wear the colon  out.  They can help jump-start bowels if constipated, but should be relied on constantly without discussing with your doctor o Osmotics (Milk of Magnesia, Fleets phosphosoda, Magnesium citrate, MiraLax, GoLytely) are safer than  o Stimulants (Senokot, Castor Oil, Dulcolax, Ex Lax)    o Avoid taking laxatives for more than 7 days in a row.   IF SEVERELY CONSTIPATED, try a Bowel Retraining  Program: o Do not use laxatives.  o Eat a diet high in roughage, such as bran cereals and leafy vegetables.  o Drink six (6) ounces of prune or apricot juice each morning.  o Eat two (2) large servings of stewed fruit each Biffle.  o Take one (1) heaping tablespoon of a psyllium-based bulking agent twice a Franek. Use sugar-free sweetener when possible to avoid excessive calories.  o Eat a normal breakfast.  o Set aside 15 minutes after breakfast to sit on the toilet, but do not strain to have a bowel movement.  o If you do not have a bowel movement by the third Mainer, use an enema and repeat the above steps.   Controlling diarrhea o Switch to liquids and simpler foods for a few days to avoid stressing your intestines further. o Avoid dairy products (especially milk & ice cream) for a short time.  The intestines often can lose the ability to digest lactose when stressed. o Avoid foods that cause gassiness or bloating.  Typical foods include beans and other legumes, cabbage, broccoli, and dairy foods.  Every person has some sensitivity to other foods, so listen to our body and avoid those foods that trigger problems for you. o Adding fiber (Citrucel, Metamucil, psyllium, Miralax) gradually can help thicken stools by absorbing excess fluid and retrain the intestines to act more normally.  Slowly increase the dose over a few weeks.  Too much fiber too soon can backfire and cause cramping & bloating. o Probiotics (such as active yogurt, Align, etc) may help repopulate the intestines and colon with normal bacteria and calm  down a sensitive digestive tract.  Most studies show it to be of mild help, though, and such products can be costly. o Medicines: - Bismuth subsalicylate (ex. Kayopectate, Pepto Bismol) every 30 minutes for up to 6 doses can help control diarrhea.  Avoid if pregnant. - Loperamide (Immodium) can slow down diarrhea.  Start with two tablets (4mg  total) first and then try one tablet every 6 hours.  Avoid if you are having fevers or severe pain.  If you are not better or start feeling worse, stop all medicines and call your doctor for advice o Call your doctor if you are getting worse or not better.  Sometimes further testing (cultures, endoscopy, X-ray studies, bloodwork, etc) may be needed to help diagnose and treat the cause of the diarrhea.  TROUBLESHOOTING IRREGULAR BOWELS 1) Avoid extremes of bowel movements (no bad constipation/diarrhea) 2) Miralax 17gm mixed in 8oz. water or juice-daily. May use BID as needed.  3) Gas-x,Phazyme, etc. as needed for gas & bloating.  4) Soft,bland diet. No spicy,greasy,fried foods.  5) Prilosec over-the-counter as needed  6) May hold gluten/wheat products from diet to see if symptoms improve.  7)  May try probiotics (Align, Activa, etc) to help calm the bowels down 7) If symptoms become worse call back immediately.  Managing Pain  Pain after surgery or related to activity is often due to strain/injury to muscle, tendon, nerves and/or incisions.  This pain is usually short-term and will improve in a few months.   Many people find it helpful to do the following things TOGETHER to help speed the process of healing and to get back to regular activity more quickly:  1. Avoid heavy physical activity at first a. No lifting greater than 20 pounds at first, then increase to lifting as tolerated over the next few weeks b. Do not push through the pain.  Listen to  your body and avoid positions and maneuvers than reproduce the pain.  Wait a few days before trying  something more intense c. Walking is okay as tolerated, but go slowly and stop when getting sore.  If you can walk 30 minutes without stopping or pain, you can try more intense activity (running, jogging, aerobics, cycling, swimming, treadmill, sex, sports, weightlifting, etc ) d. Remember: If it hurts to do it, then dont do it!  2. Take Anti-inflammatory medication a. Choose ONE of the following over-the-counter medications: i.            Acetaminophen  tabs (Tylenol) 1-2 pills with every meal and just before bedtime (avoid if you have liver problems) ii.            Naproxen  tabs (ex. Aleve) 1-2 pills twice a Seaberry (avoid if you have kidney, stomach, IBD, or bleeding problems) iii. Ibuprofen  tabs (ex. Advil, Motrin) 3-4 pills with every meal and just before bedtime (avoid if you have kidney, stomach, IBD, or bleeding problems) b. Take with food/snack around the clock for 1-2 weeks i. This helps the muscle and nerve tissues become less irritable and calm down faster  3. Use a Heating pad or Ice/Cold Pack a. 4-6 times a Eskin b. May use warm bath/hottub  or showers  4. Try Gentle Massage and/or Stretching  a. at the area of pain many times a Busta b. stop if you feel pain - do not overdo it  Try these steps together to help you body heal faster and avoid making things get worse.  Doing just one of these things may not be enough.    If you are not getting better after two weeks or are noticing you are getting worse, contact our office for further advice; we may need to re-evaluate you & see what other things we can do to help.  Anal Fistula An anal fistula is an abnormal tunnel that develops between the bowel and the skin near the outside of the anus, where stool (feces) comes out. The anus has many tiny glands that make lubricating fluid. Sometimes, these glands become plugged and infected, and that can cause a fluid-filled pocket (abscess) to form. An anal fistula often develops  after this infection or abscess. CAUSES In most cases, an anal fistula is caused by a past or current anal abscess. Other causes include:  A complication of surgery.  Trauma to the rectal area.  Radiation to the area.  Medical conditions or diseases, such as:  Chronic inflammatory bowel disease, such as Crohn disease or ulcerative colitis.  Colon cancer or rectal cancer.  Diverticular disease, such as diverticulitis.  An STD (sexually transmitted disease), such as gonorrhea, chlamydia, or syphilis.  An infection that is caused by HIV (human immunodeficiency virus).  Foreign body in the rectum. SYMPTOMS Symptoms of this condition include:  Throbbing or constant pain that may be worse while you are sitting.  Swelling or irritation around the anus.  Drainage of pus or blood from an opening near the anus.  Pain with bowel movements.  Fever or chills. DIAGNOSIS Your health care provider will examine the area to find the openings of the anal fistula and the fistula tract. The external opening of the anal fistula may be seen during a physical exam. You may also have tests, including:  An exam of the rectal area with a gloved hand (digital rectal exam).  An exam with a probe or scope to help locate the internal  opening of the fistula.  Imaging tests to find the exact location and path of the fistula. These tests may include X-rays, an ultrasound, a CT scan, or MRI. The path is made visible by a dye that is injected into the fistula opening. You may have other tests to find the cause of the anal fistula. TREATMENT The most common treatment for an anal fistula is surgery. The type of surgery that is used will depend on where the fistula is located and how complex the fistula is. Surgical options include:  A fistulotomy. The whole fistula is opened up, and the contents are drained to promote healing.  Seton placement. A silk string (seton) is placed into the fistula during a  fistulotomy. This helps to drain any infection to promote healing.  Advancement flap procedure. Tissue is removed from your rectum or the skin around the anus and is attached to the opening of the fistula.  Bioprosthetic plug. A cone-shaped plug is made from your tissue and is used to block the opening of the fistula. Some anal fistulas do not require surgery. A nonsurgical treatment option involves injecting a fibrin glue to seal the fistula. You also may be prescribed an antibiotic medicine to treat an infection. HOME CARE INSTRUCTIONS Medicines  Take over-the-counter and prescription medicines only as told by your health care provider.  If you were prescribed an antibiotic medicine, take it as told by your health care provider. Do not stop taking the antibiotic even if you start to feel better.  Use a stool softener or a laxative if told to do so by your health care provider. General Instructions  Eat a high-fiber diet as told by your health care provider. This can help to prevent constipation.  Drink enough fluid to keep your urine clear or pale yellow.  Take a warm sitz bath for 15-20 minutes, 3-4 times per Forstrom, or as told by your health care provider. Sitz baths can ease your pain and discomfort and help with healing.  Follow good hygiene to keep the anal area as clean and dry as possible. Use wet toilet paper or a moist towelette after each bowel movement.  Keep all follow-up visits as told by your health care provider. This is important. SEEK MEDICAL CARE IF:  You have increased pain that is not controlled with medicines.  You have new redness or swelling around the anal area.  You have new fluid, blood, or pus coming from the anal area.  You have tenderness or warmth around the anal area. SEEK IMMEDIATE MEDICAL CARE IF:  You have a fever.  You have severe pain.  You have chills or diarrhea.  You have severe problems urinating or having a bowel movement.   This  information is not intended to replace advice given to you by your health care provider. Make sure you discuss any questions you have with your health care provider.   Document Released: 09/14/2008 Document Revised: 06/23/2015 Document Reviewed: 12/28/2014 Elsevier Interactive Patient Education [employer]2016 [employer]vier Inc.

## 2016-01-20 NOTE — Anesthesia Procedure Notes (Signed)
Procedure Name: Intubation Date/Time: 01/20/2016 9:46 AM Performed by: Minerva EndsMIRARCHI, Kourtnie Sachs M Pre-anesthesia Checklist: Patient identified, Timeout performed, Emergency Drugs available, Suction available and Patient being monitored Patient Re-evaluated:Patient Re-evaluated prior to inductionOxygen Delivery Method: Circle system utilized Preoxygenation: Pre-oxygenation with 100% oxygen Intubation Type: IV induction Ventilation: Mask ventilation without difficulty Laryngoscope Size: Miller and 2 Grade View: Grade I Tube type: Oral Number of attempts: 1 Airway Equipment and Method: Stylet and Oral airway Placement Confirmation: ETT inserted through vocal cords under direct vision,  positive ETCO2 and breath sounds checked- equal and bilateral Secured at: 21 cm Tube secured with: Tape Dental Injury: Teeth and Oropharynx as per pre-operative assessment  Comments: IV induction Brian Gates -intubation AM CRNA--- atraumatic- teeth and mouth as preop- bilat BS

## 2016-01-20 NOTE — Progress Notes (Signed)
Sitz bathe, ice pack, abd's and mesh undies for home use. given to mother

## 2016-03-28 ENCOUNTER — Encounter (HOSPITAL_COMMUNITY): Payer: Self-pay

## 2016-03-28 ENCOUNTER — Emergency Department (HOSPITAL_COMMUNITY)
Admission: EM | Admit: 2016-03-28 | Discharge: 2016-03-29 | Disposition: A | Discharge status: Home / Self Care | Payer: Federal, State, Local not specified - PPO | Attending: Emergency Medicine | Admitting: Emergency Medicine

## 2016-03-28 DIAGNOSIS — L0291 Cutaneous abscess, unspecified: Secondary | ICD-10-CM

## 2016-03-28 DIAGNOSIS — K921 Melena: Secondary | ICD-10-CM | POA: Diagnosis not present

## 2016-03-28 DIAGNOSIS — K625 Hemorrhage of anus and rectum: Secondary | ICD-10-CM | POA: Diagnosis present

## 2016-03-28 DIAGNOSIS — K604 Rectal fistula: Secondary | ICD-10-CM | POA: Diagnosis not present

## 2016-03-28 NOTE — ED Notes (Signed)
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

## 2016-03-29 ENCOUNTER — Encounter (HOSPITAL_COMMUNITY): Payer: Self-pay

## 2016-03-29 ENCOUNTER — Emergency Department (HOSPITAL_COMMUNITY): Payer: Federal, State, Local not specified - PPO

## 2016-03-29 LAB — CBC WITH DIFFERENTIAL/PLATELET
BASOS PCT: 0 %
Basophils Absolute: 0 10*3/uL (ref 0.0–0.1)
EOS PCT: 1 %
Eosinophils Absolute: 0.1 10*3/uL (ref 0.0–0.7)
HEMATOCRIT: 43.1 % (ref 39.0–52.0)
Hemoglobin: 14.8 g/dL (ref 13.0–17.0)
Lymphocytes Relative: 21 %
Lymphs Abs: 2.2 10*3/uL (ref 0.7–4.0)
MCH: 29 pg (ref 26.0–34.0)
MCHC: 34.3 g/dL (ref 30.0–36.0)
MCV: 84.5 fL (ref 78.0–100.0)
MONO ABS: 1.1 10*3/uL — AB (ref 0.1–1.0)
MONOS PCT: 10 %
NEUTROS ABS: 7.5 10*3/uL (ref 1.7–7.7)
Neutrophils Relative %: 68 %
PLATELETS: 272 10*3/uL (ref 150–400)
RBC: 5.1 MIL/uL (ref 4.22–5.81)
RDW: 13.2 % (ref 11.5–15.5)
WBC: 10.9 10*3/uL — ABNORMAL HIGH (ref 4.0–10.5)

## 2016-03-29 LAB — I-STAT CHEM 8, ED
BUN: 12 mg/dL (ref 6–20)
CREATININE: 0.9 mg/dL (ref 0.61–1.24)
Calcium, Ion: 1.17 mmol/L (ref 1.12–1.23)
Chloride: 101 mmol/L (ref 101–111)
GLUCOSE: 89 mg/dL (ref 65–99)
HEMATOCRIT: 47 % (ref 39.0–52.0)
HEMOGLOBIN: 16 g/dL (ref 13.0–17.0)
Potassium: 3.5 mmol/L (ref 3.5–5.1)
Sodium: 138 mmol/L (ref 135–145)
TCO2: 22 mmol/L (ref 0–100)

## 2016-03-29 MED ORDER — CIPROFLOXACIN HCL 500 MG PO TABS
500.0000 mg | ORAL_TABLET | Freq: Once | ORAL | Status: AC
  Administered 2016-03-29: 500 mg via ORAL
  Filled 2016-03-29: qty 1

## 2016-03-29 MED ORDER — CIPROFLOXACIN HCL 500 MG PO TABS: 500.0000 mg | ORAL_TABLET | Freq: Two times a day (BID) | ORAL | Status: AC

## 2016-03-29 MED ORDER — SODIUM CHLORIDE 0.9 % IV BOLUS (SEPSIS)
1000.0000 mL | Freq: Once | INTRAVENOUS | Status: AC
  Administered 2016-03-29: 1000 mL via INTRAVENOUS

## 2016-03-29 MED ORDER — OXYCODONE-ACETAMINOPHEN 5-325 MG PO TABS: 1.0000 | ORAL_TABLET | Freq: Four times a day (QID) | ORAL | Status: AC | PRN

## 2016-03-29 MED ORDER — ACETAMINOPHEN 500 MG PO TABS
1000.0000 mg | ORAL_TABLET | Freq: Once | ORAL | Status: AC
  Administered 2016-03-29: 1000 mg via ORAL
  Filled 2016-03-29: qty 2

## 2016-03-29 MED ORDER — KETOROLAC TROMETHAMINE 30 MG/ML IJ SOLN
30.0000 mg | Freq: Once | INTRAMUSCULAR | Status: AC
  Administered 2016-03-29: 30 mg via INTRAVENOUS
  Filled 2016-03-29: qty 1

## 2016-03-29 MED ORDER — METRONIDAZOLE 500 MG PO TABS: 500.0000 mg | ORAL_TABLET | Freq: Three times a day (TID) | ORAL | Status: AC

## 2016-03-29 MED ORDER — IOPAMIDOL (ISOVUE-300) INJECTION 61%: 100.0000 mL | Freq: Once | INTRAVENOUS | Status: DC | PRN

## 2016-03-29 MED ORDER — METRONIDAZOLE 500 MG PO TABS
500.0000 mg | ORAL_TABLET | Freq: Once | ORAL | Status: AC
  Administered 2016-03-29: 500 mg via ORAL
  Filled 2016-03-29: qty 1

## 2016-03-29 MED ORDER — LIDOCAINE HCL 2 % EX GEL
1.0000 "application " | Freq: Once | CUTANEOUS | Status: AC
  Administered 2016-03-29: 1
  Filled 2016-03-29: qty 11

## 2016-03-29 NOTE — ED Provider Notes (Signed)
CSN: [id number]     Arrival date & time 03/28/16  2021 History  By signing my name below, I, Brian Gates, attest that this documentation has been prepared under the direction and in the presence of Shaneque Merkle, MD. Electronically Signed: Angelene Giovanni, ED Scribe. 03/29/2016. 3:48 AM .    Chief Complaint  Patient presents with  . Rectal Pain   Patient is a 25 y.o. male presenting with hematochezia. The history is provided by the patient. No language interpreter was used.  Rectal Bleeding Quality: yellow/green gel-like  Amount:  Scant Duration:  3 days Timing:  Constant Progression:  Worsening Chronicity:  New Context: rectal pain   Pain details:    Quality:  Aching   Severity:  Moderate   Duration:  3 days   Timing:  Constant   Progression:  Worsening Relieved by:  None tried Worsened by:  Nothing tried Ineffective treatments:  None tried Associated symptoms: no fever and no vomiting   Risk factors: hx of colorectal surgery    HPI Comments: Brian Gates is a 25 y.o. male who presents to the Emergency Department complaining of ongoing gradually worsening rectal pain onset 3 days ago. He reports associated multiple episodes of loose BM that is painful and a drainage of yellow/green gel-like substance from gluteal cleft. No alleviating factors noted. Pt has not tried any medication PTA. Pt had a fistulectomy on 01/20/16 by Dr. Michaell Cowing. He states that he was supposed to follow up in May but had to reschedule due to school finals and has an appointment 04/03/16. He states that he was complaint with the antibiotics he received from the surgery. No fever, chills, n/v, or constipation.    History reviewed. No pertinent past medical history. Past Surgical History  Procedure Laterality Date  . Wisdom tooth extraction    . Evaluation under anesthesia with fistulectomy N/A 01/20/2016    Procedure: EXAM UNDER ANESTHESIA REPAIR PERIRECTAL FISTULA , LIFT repair of enter spincteric fistula;   Surgeon: Karie Soda, MD;  Location: [facility];  Service: General;  Laterality: N/A;   History reviewed. No pertinent family history. Social History  Substance Use Topics  . Smoking status: Never Smoker   . Smokeless tobacco: Never Used  . Alcohol Use: Yes     Comment: occassional    Review of Systems  Constitutional: Negative for fever and chills.  Gastrointestinal: Positive for hematochezia and rectal pain. Negative for nausea, vomiting and constipation.  All other systems reviewed and are negative.     Allergies  Review of patient's allergies indicates no known allergies.  Home Medications   Prior to Admission medications   Medication Sig Start Date End Date Taking? Authorizing Provider  HYDROcodone-acetaminophen (NORCO/VICODIN) 5-325 MG tablet Take 1-2 tablets by mouth every 4 (four) hours as needed for moderate pain or severe pain. Patient not taking: Reported on 03/28/2016 01/20/16   Karie Soda, MD  naproxen (NAPROSYN) 500 MG tablet Take 1 tablet (500 mg total) by mouth 2 (two) times daily. Patient not taking: Reported on 03/28/2016 01/20/16   Karie Soda, MD   BP 131/75 mmHg  Pulse 102  Temp(Src) 98.8 F (37.1 C) (Oral)  Resp 18  Ht  (1.778 m)  Wt 320 lb (145.151 kg)  BMI 45.92 kg/m2  SpO2 99% Physical Exam  Constitutional: He is oriented to person, place, and time. He appears well-developed and well-nourished.  HENT:  Head: Normocephalic and atraumatic.  Mouth/Throat: Oropharynx is clear and moist.  Eyes: Pupils are equal, round,  and reactive to light.  Neck: Normal range of motion. Neck supple.  Cardiovascular: Normal rate and regular rhythm.   Pulmonary/Chest: Effort normal.  Lungs are clear  Abdominal: Soft. Bowel sounds are normal. There is no tenderness. There is no rebound and no guarding.  Genitourinary:  Mucus like discharge in the right perirectal area chaperones present  Musculoskeletal: Normal range of motion.  Neurological: He is alert and  oriented to person, place, and time. He has normal reflexes.  Skin: Skin is warm and dry.  Psychiatric: He has a normal mood and affect.  Nursing note and vitals reviewed.   ED Course  Procedures (including critical care time) DIAGNOSTIC STUDIES: Oxygen Saturation is 99% on RA, normal by my interpretation.    COORDINATION OF CARE: 1:02 AM- Pt advised of plan for treatment and pt agrees. Pt will receive IV fluids, Toradol, and Xylocaine. He will also receive CT abdomen and lab work for further evaluation.   3:48 AM - Consult to general surgery. Will discharge patient.    Labs Review Labs Reviewed - No data to display  Imaging Review No results found.   Christorpher Hisaw, MD has personally reviewed and evaluated these images and lab results as part of her medical decision-making.   EKG Interpretation None      MDM   Final diagnoses:  None   Filed Vitals:   03/29/16 0400 03/29/16 0430  BP: 125/73 144/96  Pulse: 106 107  Temp:    Resp:     Results for orders placed or performed during the hospital encounter of 03/28/16  CBC with Differential/Platelet  Result Value Ref Range   WBC 10.9 (H) 4.0 - 10.5 K/uL   RBC 5.10 4.22 - 5.81 MIL/uL   Hemoglobin 14.8 13.0 - 17.0 g/dL   HCT 69.643.1 29.539.0 - 28.452.0 %   MCV 84.5 78.0 - 100.0 fL   MCH 29.0 26.0 - 34.0 pg   MCHC 34.3 30.0 - 36.0 g/dL   RDW 13.213.2 44.011.5 - 10.215.5 %   Platelets 272 150 - 400 K/uL   Neutrophils Relative % 68 %   Neutro Abs 7.5 1.7 - 7.7 K/uL   Lymphocytes Relative 21 %   Lymphs Abs 2.2 0.7 - 4.0 K/uL   Monocytes Relative 10 %   Monocytes Absolute 1.1 (H) 0.1 - 1.0 K/uL   Eosinophils Relative 1 %   Eosinophils Absolute 0.1 0.0 - 0.7 K/uL   Basophils Relative 0 %   Basophils Absolute 0.0 0.0 - 0.1 K/uL  I-Stat Chem 8, ED  Result Value Ref Range   Sodium 138 135 - 145 mmol/L   Potassium 3.5 3.5 - 5.1 mmol/L   Chloride 101 101 - 111 mmol/L   BUN 12 6 - 20 mg/dL   Creatinine, Ser 7.250.90 0.61 - 1.24 mg/dL   Glucose,  Bld 89 65 - 99 mg/dL   Calcium, Ion 3.661.17 4.401.12 - 1.23 mmol/L   TCO2 22 0 - 100 mmol/L   Hemoglobin 16.0 13.0 - 17.0 g/dL   HCT 34.747.0 42.539.0 - 95.652.0 %   Ct Pelvis W Contrast  03/29/2016  CLINICAL DATA:  Rectal pain for 3 days. Loose stools with yellow green drainage. Surgery 01/20/2016 for fistula. EXAM: CT PELVIS WITH CONTRAST TECHNIQUE: Multidetector CT imaging of the pelvis was performed using the standard protocol following the bolus administration of intravenous contrast. CONTRAST:  100 mL Isovue-300 COMPARISON:  12/25/2015 FINDINGS: Residual thickening and infiltration along the right side of the anorectal junction with extension to  the right medial gluteal crease suggesting residual or recurrent perianal abscess and fistula. The collection measures only about 7 mm diameter. Appearance is similar to previous study. No infiltration in the surrounding fat suggest cellulitis. Visualized pelvic organs appear intact. The appendix is normal. Rectosigmoid colon is decompressed without inflammatory change. Bladder wall is not thickened. Prostate gland is not enlarged. Pelvis and hips appear intact. 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 Electronically Signed   By: Burman Nieves M.D.   On: 03/29/2016 02:44    Medications  iopamidol (ISOVUE-300) 61 % injection 100 mL (not administered)  lidocaine (XYLOCAINE) 2 % jelly 1 application (1 application Other Given 03/29/16 0102)  sodium chloride 0.9 % bolus 1,000 mL (0 mLs Intravenous Stopped 03/29/16 0233)  ketorolac (TORADOL) 30 MG/ML injection 30 mg (30 mg Intravenous Given 03/29/16 0126)  acetaminophen (TYLENOL) tablet 1,000 mg (1,000 mg Oral Given 03/29/16 0437)  ciprofloxacin (CIPRO) tablet 500 mg (500 mg Oral Given 03/29/16 0437)  metroNIDAZOLE (FLAGYL) tablet 500 mg (500 mg Oral Given 03/29/16 0437)    4 am Case d/w Dr. Gerrit Friends via phone.  Start antibiotics, sitz baths and discharge patient with pain medication.  Have  patient make an appointment to be seen in office with Dr. Michaell Cowing   Cipro and flagyl RX x 7 days.  Start sitz baths at home.  Rx for percocet and you must call to be seen by surgery at their office this week.  Patient verbalizes understanding and agrees to follow up.  Strict return precautions given.    I personally performed the services described in this documentation, which was scribed in my presence. The recorded information has been reviewed and is accurate.     Cy Blamer, MD 03/29/16 [phone]

## 2017-03-24 IMAGING — CT CT PELVIS W/ CM
2 of 3 series · 15 of 46 positions shown, 17 images · IV contrast (omnipaque)
Comparison: None.

CLINICAL DATA: Rectal pain x1 week, evaluate for perirectal abscess

EXAM:
CT PELVIS WITH CONTRAST
TECHNIQUE: Multidetector CT imaging of the pelvis was performed using the
standard protocol following the bolus administration of intravenous
contrast.
CONTRAST:  100mL OMNIPAQUE IOHEXOL 300 MG/ML  SOLN

[Series 2: pelvis with · axial · 0.84mm/px · z∈[-772,-478]mm · 12 of 69 slices shown, 14 images]
[im 5/69  soft-tissue]
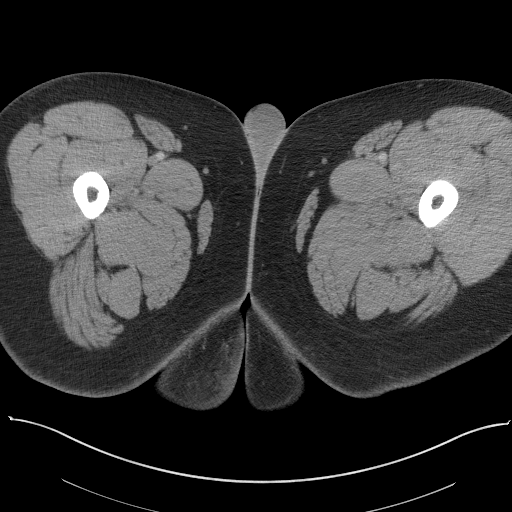
[im 5/69  bone]
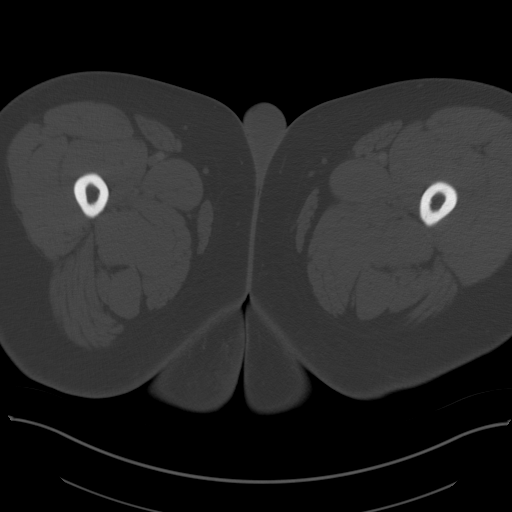
[im 9/69  soft-tissue]
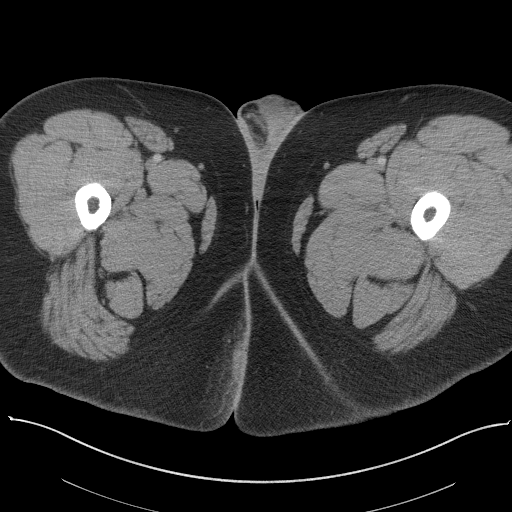
[im 16/69  soft-tissue]
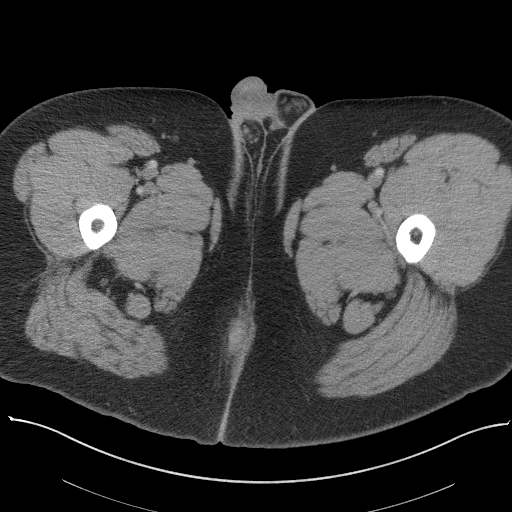
[im 20/69  soft-tissue]
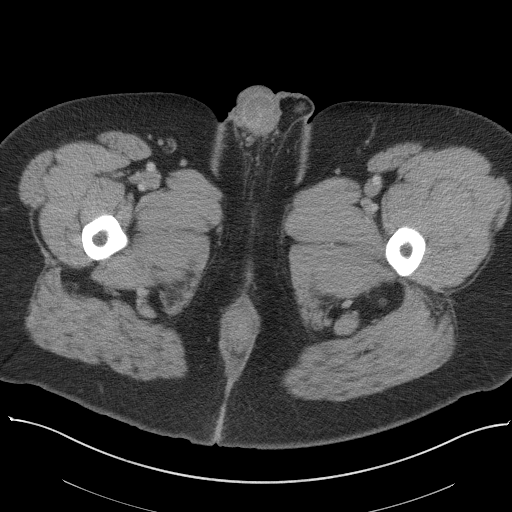
[im 27/69  soft-tissue]
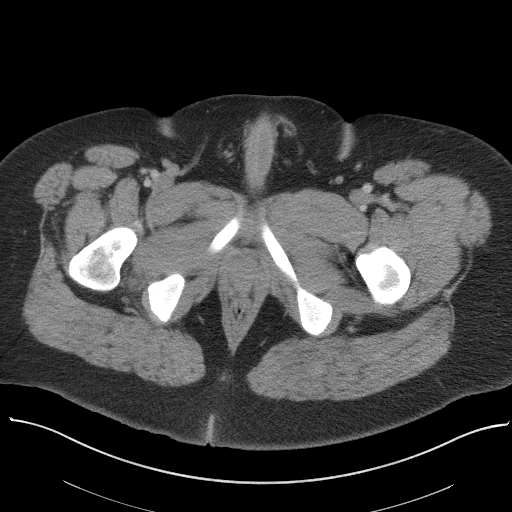
[im 31/69  soft-tissue]
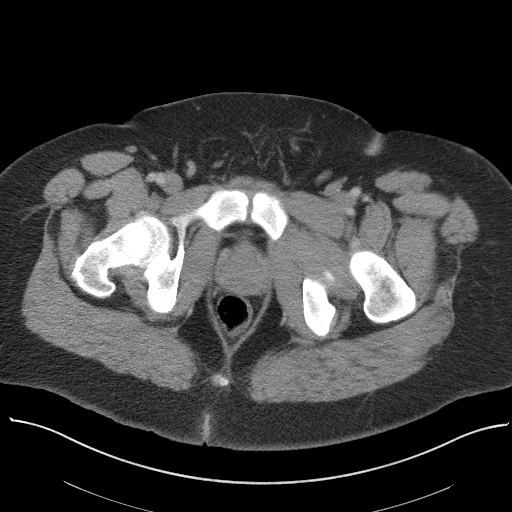
[im 38/69  soft-tissue]
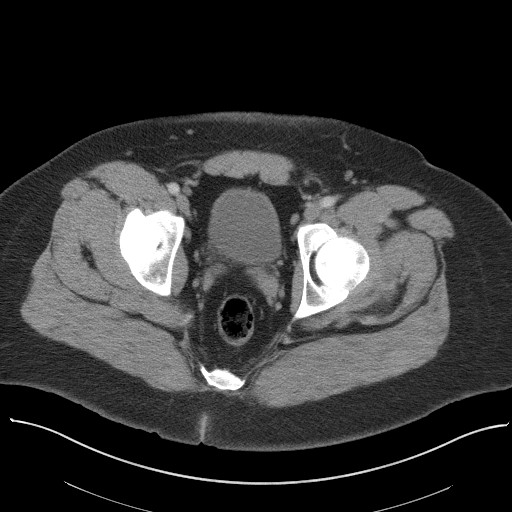
[im 42/69  soft-tissue]
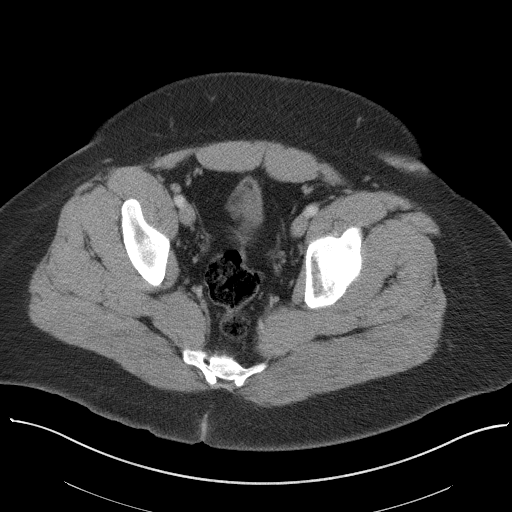
[im 49/69  soft-tissue]
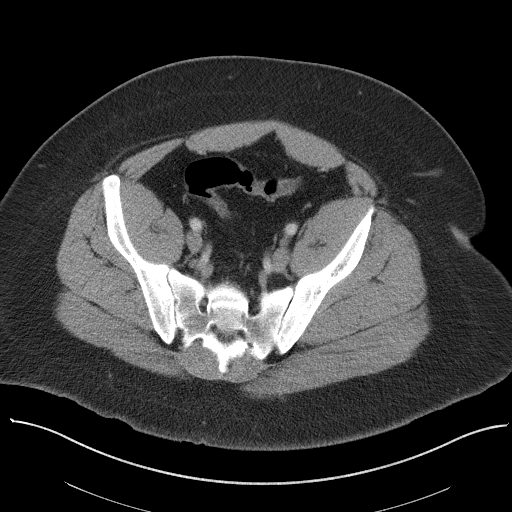
[im 49/69  bone]
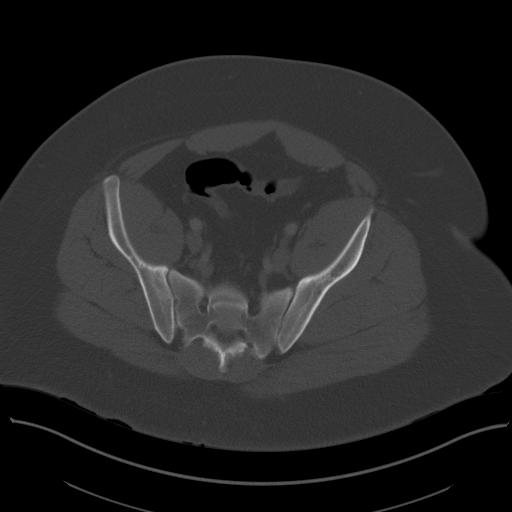
[im 53/69  soft-tissue]
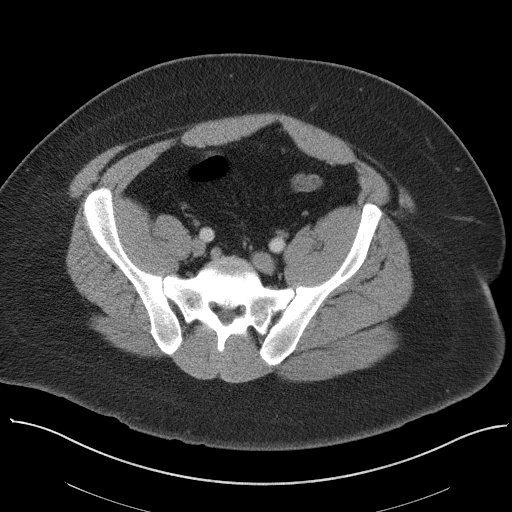
[im 60/69  soft-tissue]
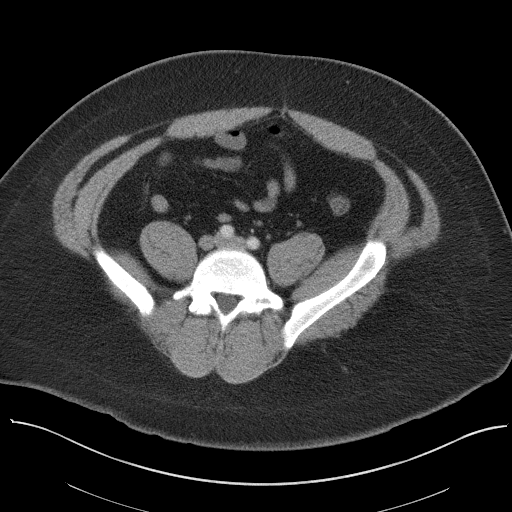
[im 64/69  soft-tissue]
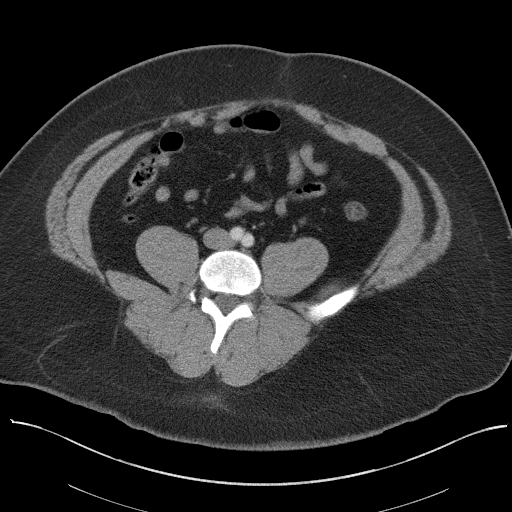

[Series 3: coronal images · coronal · 0.67mm/px · 3 of 127 slices shown]
[im 43/127  soft-tissue]
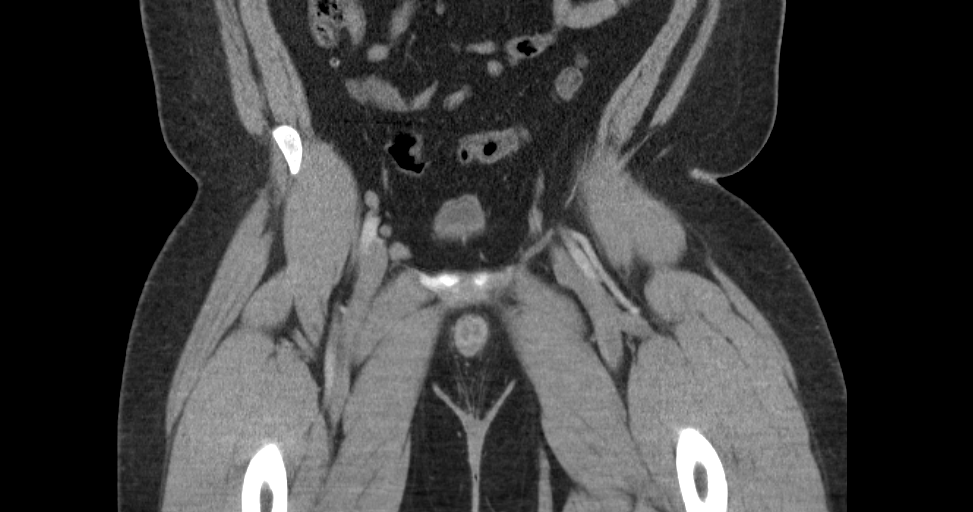
[im 57/127  soft-tissue]
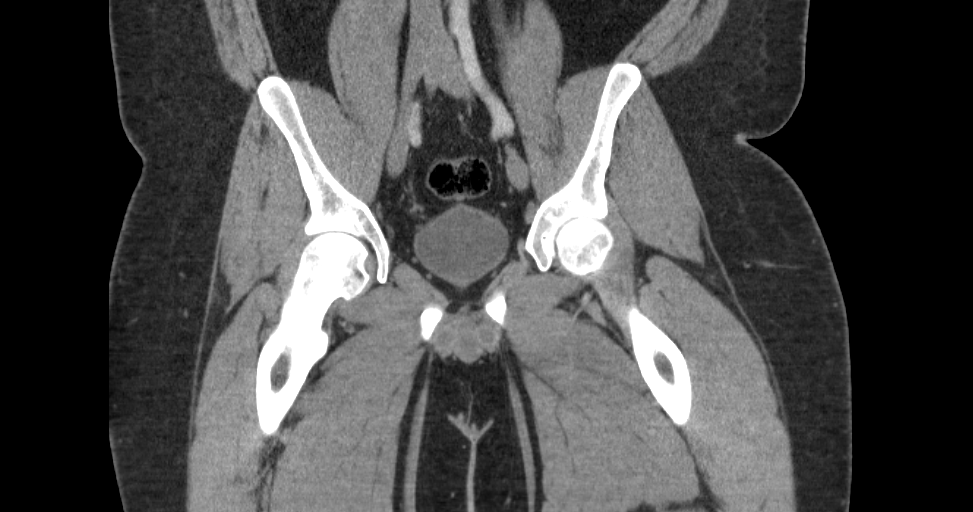
[im 71/127  soft-tissue]
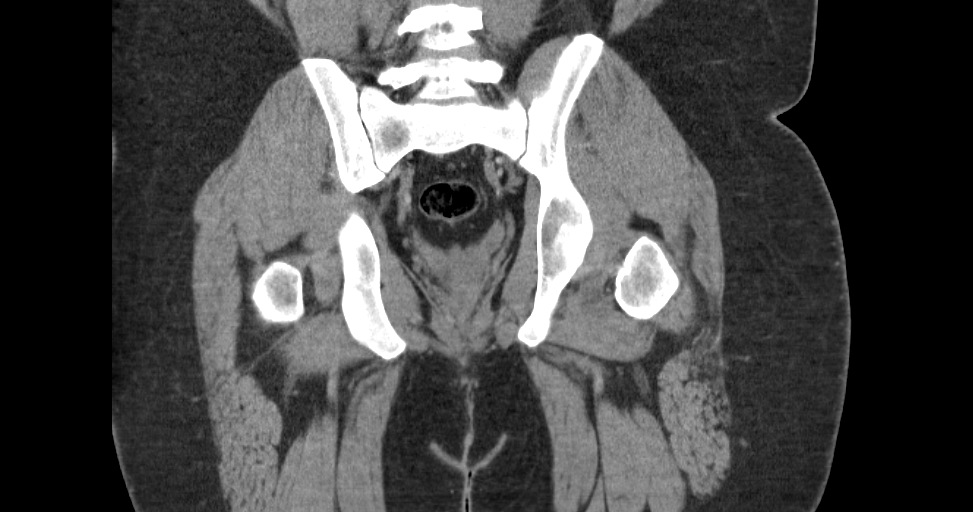

[15 of 46 positions shown; findings below may reference images not displayed]

FINDINGS: Urinary Tract: Bladder is within normal limits.

Bowel: Suspected fistulous track along the right lateral aspect of
the rectum (series 2/ image 52) and exiting along the right gluteal
cleft (series 2/ image 60). No associated perirectal fluid
collection/abscess.

Visualized bowel is otherwise unremarkable. Normal appendix (series
2/ image 3).

Vascular/Lymphatic: Visualized infrarenal abdominal aorta and
bilateral iliac arteries are within normal limits.

No suspicious pelvic lymphadenopathy.

Reproductive: Prostate is unremarkable.

Other: No pelvic ascites.

Small fat containing bilateral inguinal hernias (series 2/ image
37).

Musculoskeletal: Visualized osseous structures are within normal
limits.
IMPRESSION: Suspected anorectal fistula along the right lateral aspect of the
rectum (9 o'clock position) and exiting along the right gluteal
cleft.

No associated perirectal fluid collection/abscess.

## 2017-06-27 IMAGING — CT CT PELVIS W/ CM
2 of 3 series · 16 of 46 positions shown, 18 images · IV contrast (agent unspecified)
Comparison: 12/25/2015

CLINICAL DATA: Rectal pain for 3 days. Loose stools with yellow
green drainage. Surgery 01/20/2016 for fistula.

EXAM:
CT PELVIS WITH CONTRAST
TECHNIQUE: Multidetector CT imaging of the pelvis was performed using the
standard protocol following the bolus administration of intravenous
contrast.
CONTRAST:  100 mL Ksovue-6YY

[Series 3: pelvis with · axial · 0.93mm/px · z∈[+844,+1124]mm · 13 of 66 slices shown, 15 images]
[im 5/66  soft-tissue]
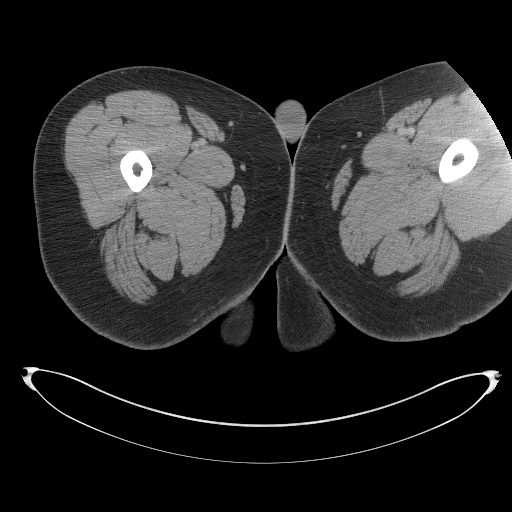
[im 5/66  bone]
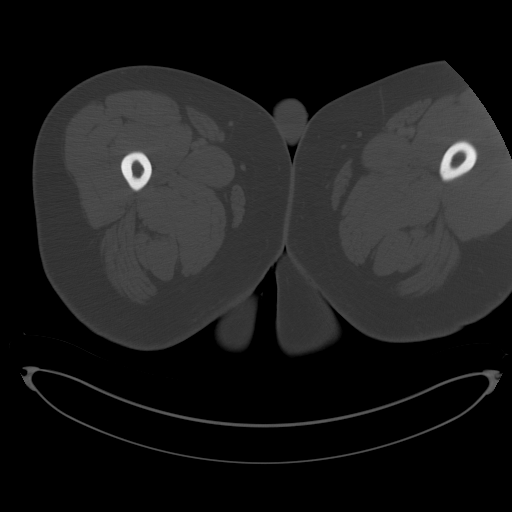
[im 9/66  soft-tissue]
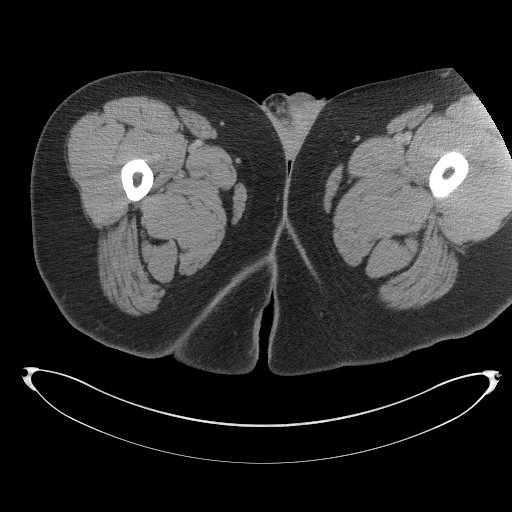
[im 13/66  soft-tissue]
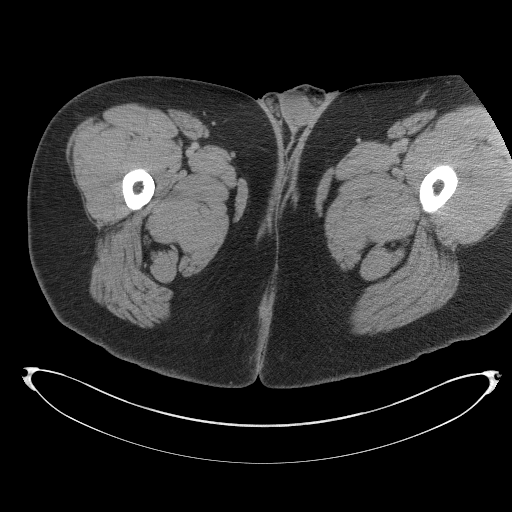
[im 19/66  soft-tissue]
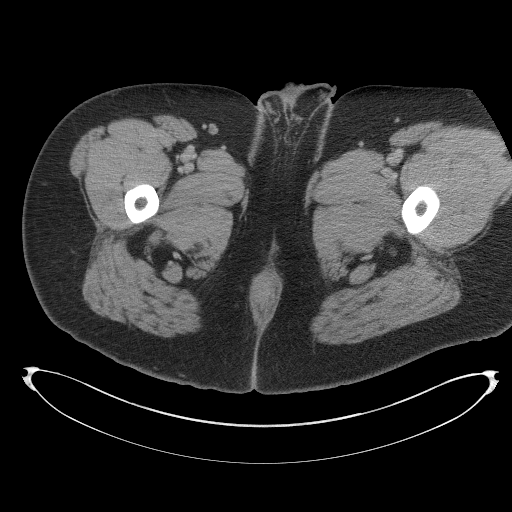
[im 24/66  soft-tissue]
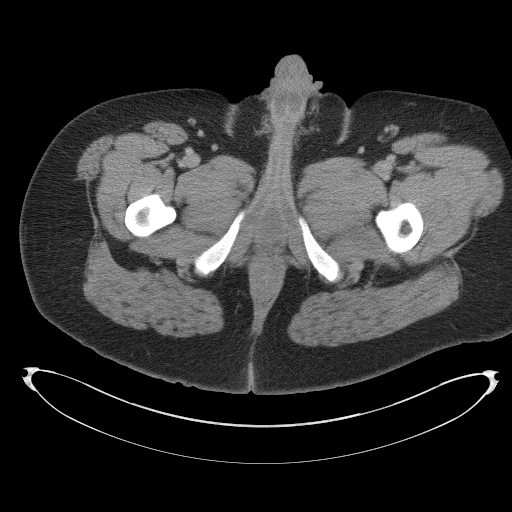
[im 28/66  soft-tissue]
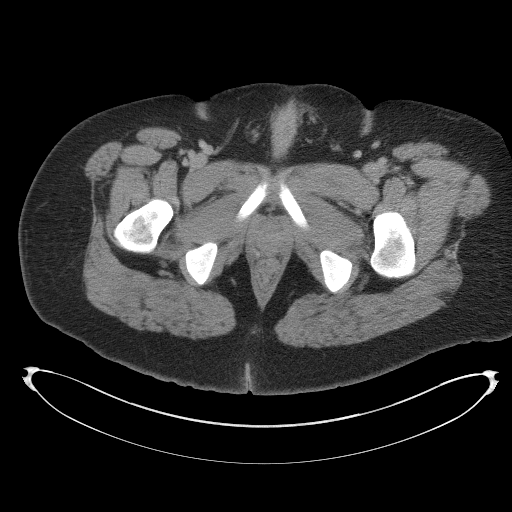
[im 34/66  soft-tissue]
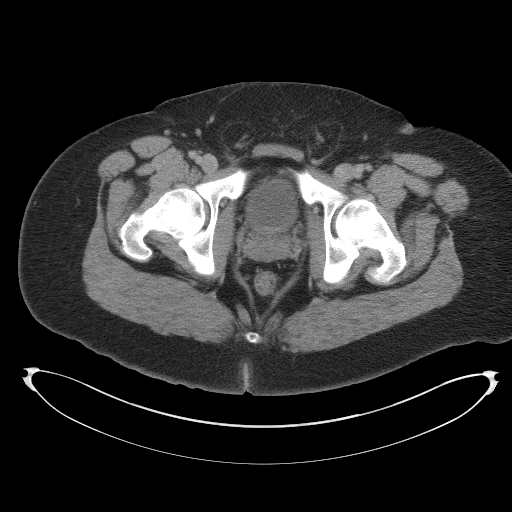
[im 38/66  soft-tissue]
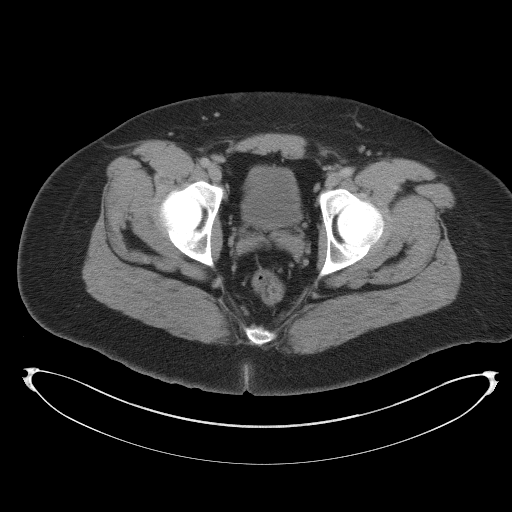
[im 42/66  soft-tissue]
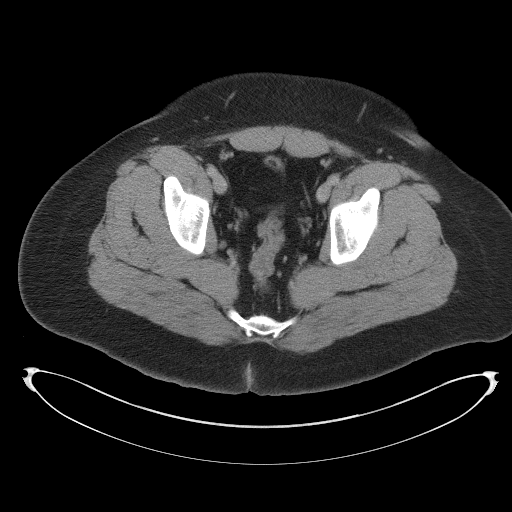
[im 42/66  bone]
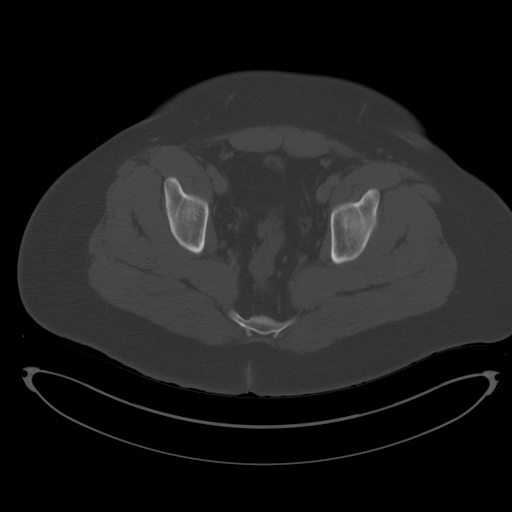
[im 47/66  soft-tissue]
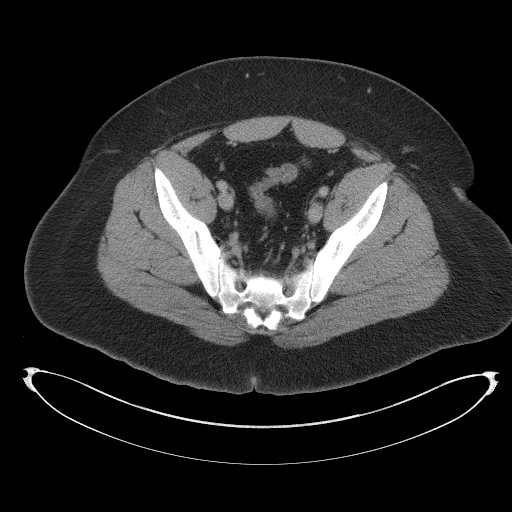
[im 53/66  soft-tissue]
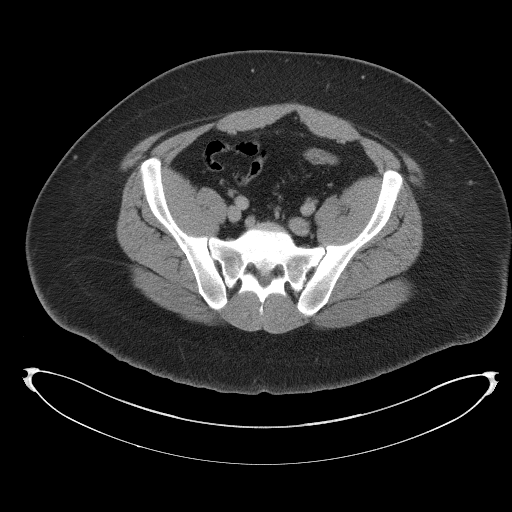
[im 57/66  soft-tissue]
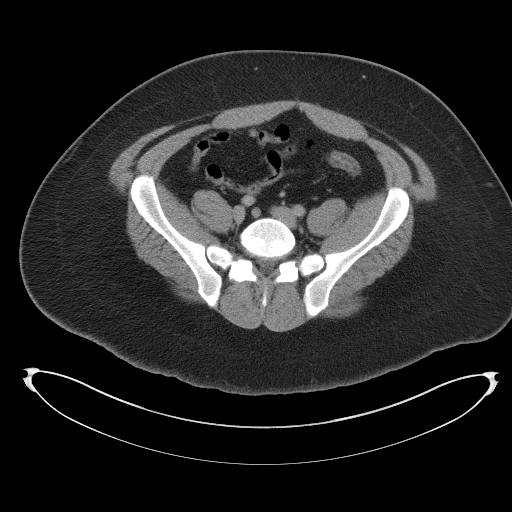
[im 61/66  soft-tissue]
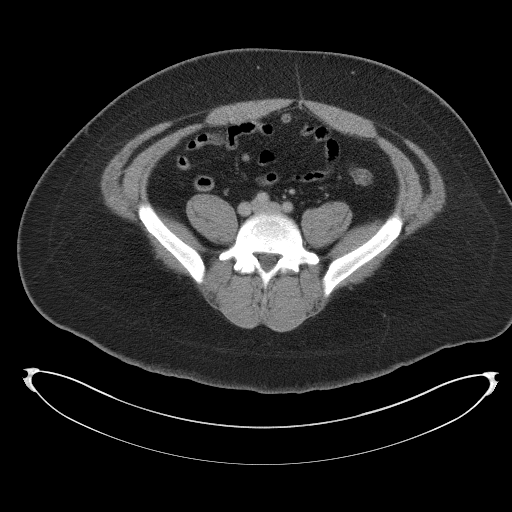

[Series 4: coronal images · coronal · 0.65mm/px · 3 of 149 slices shown]
[im 50/149  soft-tissue]
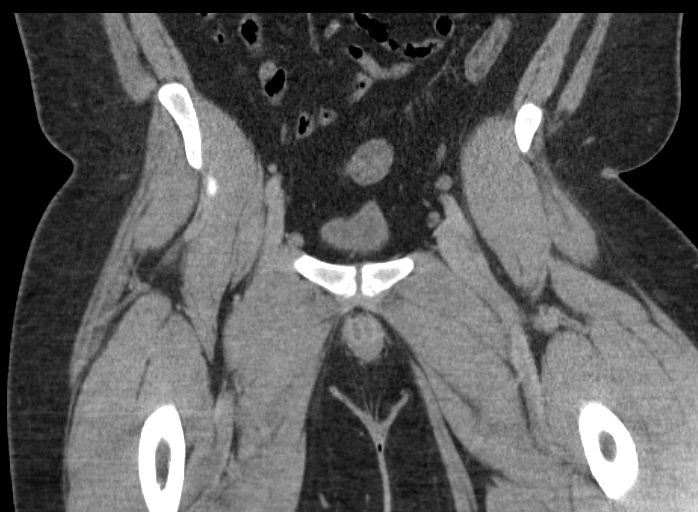
[im 66/149  soft-tissue]
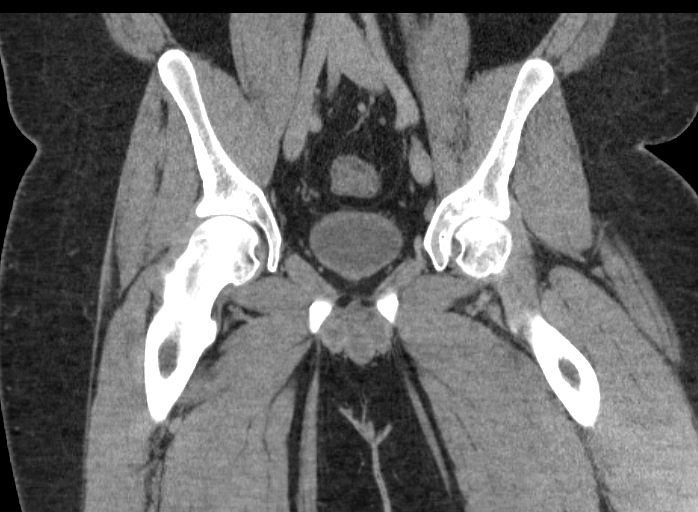
[im 83/149  soft-tissue]
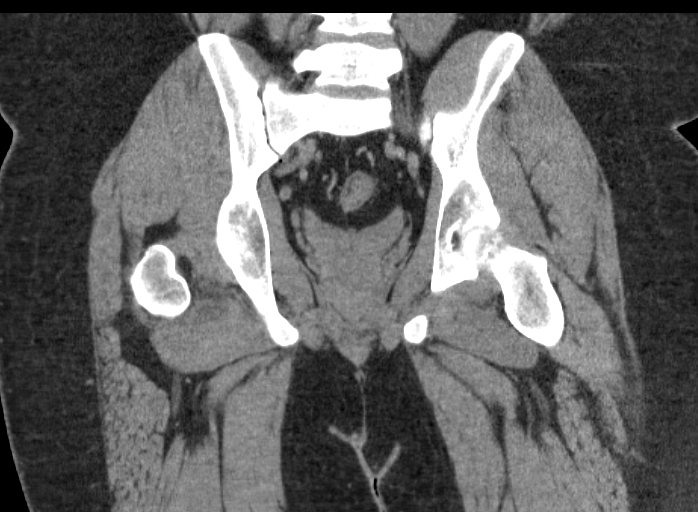

[16 of 46 positions shown; findings below may reference images not displayed]

FINDINGS: Residual thickening and infiltration along the right side of the
anorectal junction with extension to the right medial gluteal crease
suggesting residual or recurrent perianal abscess and fistula. The
collection measures only about 7 mm diameter. Appearance is similar
to previous study. No infiltration in the surrounding fat suggest
cellulitis. Visualized pelvic organs appear intact. The appendix is
normal. Rectosigmoid colon is decompressed without inflammatory
change. Bladder wall is not thickened. Prostate gland is not
enlarged. Pelvis and hips appear intact.
IMPRESSION: Small residual or recurrent right perianal abscess and fistula.
